# Patient Record
Sex: Female | Born: 1970 | Race: Black or African American | Hispanic: No | Marital: Married | Smoking: Current every day smoker
Health system: Southern US, Community
[De-identification: ages and names within clinical notes are randomized; demographics above are authoritative.]

## PROBLEM LIST (undated history)

## (undated) DIAGNOSIS — K219 Gastro-esophageal reflux disease without esophagitis: Secondary | ICD-10-CM

## (undated) DIAGNOSIS — I10 Essential (primary) hypertension: Secondary | ICD-10-CM

## (undated) DIAGNOSIS — D649 Anemia, unspecified: Secondary | ICD-10-CM

## (undated) HISTORY — PX: LIPOSUCTION AUOLOGOUS FAT TRANSFER TO BUTTOCKS: SHX6685

## (undated) HISTORY — PX: TUBAL LIGATION: SHX77

## (undated) HISTORY — PX: ABDOMINAL HYSTERECTOMY: SHX81

---

## 2006-07-24 ENCOUNTER — Emergency Department: Payer: Self-pay | Admitting: Emergency Medicine

## 2007-11-14 ENCOUNTER — Ambulatory Visit: Payer: Self-pay | Admitting: Family Medicine

## 2009-06-14 ENCOUNTER — Ambulatory Visit: Payer: Self-pay | Admitting: Family Medicine

## 2010-01-01 ENCOUNTER — Ambulatory Visit: Payer: Self-pay | Admitting: Internal Medicine

## 2011-01-10 ENCOUNTER — Ambulatory Visit: Payer: Self-pay | Admitting: Internal Medicine

## 2012-05-08 ENCOUNTER — Ambulatory Visit: Payer: Self-pay | Admitting: Internal Medicine

## 2013-07-09 ENCOUNTER — Ambulatory Visit: Payer: Self-pay | Admitting: Family Medicine

## 2013-07-09 LAB — URINALYSIS, COMPLETE
BILIRUBIN, UR: NEGATIVE
GLUCOSE, UR: NEGATIVE mg/dL (ref 0–75)
KETONE: NEGATIVE
Leukocyte Esterase: NEGATIVE
Nitrite: NEGATIVE
Ph: 6.5 (ref 4.5–8.0)
Specific Gravity: 1.02 (ref 1.003–1.030)

## 2013-07-09 LAB — WET PREP, GENITAL

## 2013-07-09 LAB — PREGNANCY, URINE: PREGNANCY TEST, URINE: NEGATIVE m[IU]/mL

## 2013-07-10 LAB — GC/CHLAMYDIA PROBE AMP

## 2013-07-12 LAB — URINE CULTURE

## 2013-07-25 ENCOUNTER — Ambulatory Visit: Payer: Self-pay | Admitting: Family Medicine

## 2014-01-23 ENCOUNTER — Emergency Department: Payer: Self-pay | Admitting: Emergency Medicine

## 2014-01-23 LAB — COMPREHENSIVE METABOLIC PANEL
ALT: 20 U/L
ANION GAP: 6 — AB (ref 7–16)
Albumin: 3.9 g/dL (ref 3.4–5.0)
Alkaline Phosphatase: 64 U/L
BUN: 16 mg/dL (ref 7–18)
Bilirubin,Total: 0.3 mg/dL (ref 0.2–1.0)
CO2: 25 mmol/L (ref 21–32)
CREATININE: 0.83 mg/dL (ref 0.60–1.30)
Calcium, Total: 8.7 mg/dL (ref 8.5–10.1)
Chloride: 108 mmol/L — ABNORMAL HIGH (ref 98–107)
EGFR (African American): >60
Glucose: 86 mg/dL (ref 65–99)
OSMOLALITY: 278 (ref 275–301)
Potassium: 3.5 mmol/L (ref 3.5–5.1)
SGOT(AST): 17 U/L (ref 15–37)
Sodium: 139 mmol/L (ref 136–145)
Total Protein: 7 g/dL (ref 6.4–8.2)

## 2014-01-23 LAB — CK TOTAL AND CKMB (NOT AT ARMC): CK, Total: 103 U/L

## 2014-01-23 LAB — CBC
HCT: 44.9 % (ref 35.0–47.0)
HGB: 14.1 g/dL (ref 12.0–16.0)
MCH: 29.4 pg (ref 26.0–34.0)
MCHC: 31.4 g/dL — AB (ref 32.0–36.0)
MCV: 93 fL (ref 80–100)
Platelet: 192 10*3/uL (ref 150–440)
RBC: 4.81 10*6/uL (ref 3.80–5.20)
RDW: 13.3 % (ref 11.5–14.5)
WBC: 5.7 10*3/uL (ref 3.6–11.0)

## 2014-01-23 LAB — TROPONIN I

## 2014-07-15 ENCOUNTER — Ambulatory Visit
Admit: 2014-07-15 | Disposition: A | Discharge status: Home / Self Care | Payer: Self-pay | Attending: Family Medicine | Admitting: Family Medicine

## 2015-05-05 ENCOUNTER — Encounter: Payer: Self-pay | Admitting: *Deleted

## 2015-05-05 ENCOUNTER — Ambulatory Visit
Admission: EM | Admit: 2015-05-05 | Discharge: 2015-05-05 | Disposition: A | Discharge status: Home / Self Care | Payer: BLUE CROSS/BLUE SHIELD | Attending: Family Medicine | Admitting: Family Medicine

## 2015-05-05 DIAGNOSIS — N760 Acute vaginitis: Secondary | ICD-10-CM | POA: Diagnosis not present

## 2015-05-05 HISTORY — DX: Essential (primary) hypertension: I10

## 2015-05-05 LAB — URINALYSIS COMPLETE WITH MICROSCOPIC (ARMC ONLY)
BILIRUBIN URINE: NEGATIVE
GLUCOSE, UA: NEGATIVE mg/dL
Ketones, ur: NEGATIVE mg/dL
Leukocytes, UA: NEGATIVE
Nitrite: NEGATIVE
PH: 7 (ref 5.0–8.0)
Protein, ur: NEGATIVE mg/dL
Specific Gravity, Urine: 1.02 (ref 1.005–1.030)

## 2015-05-05 LAB — WET PREP, GENITAL
SPERM: NONE SEEN
TRICH WET PREP: NONE SEEN

## 2015-05-05 MED ORDER — METRONIDAZOLE 500 MG PO TABS: 500.0000 mg | ORAL_TABLET | Freq: Two times a day (BID) | ORAL | Status: DC

## 2015-05-05 MED ORDER — FLUCONAZOLE 150 MG PO TABS: 150.0000 mg | ORAL_TABLET | Freq: Every day | ORAL | Status: DC

## 2015-05-05 NOTE — ED Provider Notes (Addendum)
CSN: [id number]     Arrival date & time 05/05/15  1755 History   First MD Initiated Contact with Patient 05/05/15 1807     Chief Complaint  Patient presents with  . Urinary Tract Infection   (Consider location/radiation/quality/duration/timing/severity/associated sxs/prior Treatment) HPI: Patient presents today with symptoms of urinary frequency and dysuria. Patient states that she's had symptoms for the last 2 days. She has a low-grade fever in the office but was unaware of this. She denies having URI symptoms. She does also have some vaginal discharge with itching. She denies any genital lesions. She is married and denies any risk of STDs. She denies any abdominal pain, nausea, vomiting, diarrhea, flank pain. She denies any history of kidney stones. She does admit that her menstrual cycle should be starting in the next week. She has had a history of a BTL.  Past Medical History  Diagnosis Date  . Hypertension    Past Surgical History  Procedure Laterality Date  . Tubal ligation     History reviewed. No pertinent family history. Social History  Substance Use Topics  . Smoking status: Former Research scientist (life sciences)  . Smokeless tobacco: Never Used  . Alcohol Use: Yes   OB History    No data available     Review of Systems: Negative except mentioned above.   Allergies  Review of patient's allergies indicates no known allergies.  Home Medications   Prior to Admission medications   Medication Sig Start Date End Date Taking? Authorizing Provider  lisinopril-hydrochlorothiazide (PRINZIDE,ZESTORETIC) 10-12.5 MG tablet Take 1 tablet by mouth daily.   Yes Historical Provider, MD   Meds Ordered and Administered this Visit  Medications - No data to display  BP 119/84 mmHg  Pulse 85  Temp(Src) 100.1 F (37.8 C) (Oral)  Resp 18  Ht 5\' 1"  (1.549 m)  Wt 190 lb (86.183 kg)  BMI 35.92 kg/m2  SpO2 100%  LMP 04/11/2015 No data found.   Physical Exam:  GENERAL: NAD HEENT: no pharyngeal  erythema, no exudate RESP: CTA B CARD: RRR ABD: +BS, NT/ND, no rebound or guarding GU: no genital lesions, minimal discharge, no CMT appreciated  NEURO: CN II-XII grossly intact    ED Course  Procedures (including critical care time)  Labs Review Labs Reviewed  URINALYSIS COMPLETEWITH MICROSCOPIC ([place]) - Abnormal; Notable for the following:    Hgb urine dipstick TRACE (*)    Bacteria, UA FEW (*)    Squamous Epithelial / LPF 6-30 (*)    All other components within normal limits  URINE CULTURE    Imaging Review No results found.   MDM   A/P: Vaginitis- urine analysis is not suggestive of UTI, will send urine for culture, wet prep suggest BV and yeast, will treat with Flagyl and Diflucan, I have asked that the patient monitor symptoms closely and if any worsening symptoms including fever that she seek medical attention promptly. Will await GC Chlamydia results and inform patient if positive. I've encourage the patient to not be sexually active until results are back. Temperature was repeated before discharge and was 98.3.   Paulina Fusi, MD 05/05/15 1944  Paulina Fusi, MD 05/05/15 [phone]

## 2015-05-05 NOTE — ED Notes (Signed)
Patient started having urinary frequency and burning on urination 2 days ago. Additional symptoms of itching and odor are also present.

## 2015-05-06 ENCOUNTER — Telehealth: Payer: Self-pay | Admitting: *Deleted

## 2015-05-06 LAB — CHLAMYDIA/NGC RT PCR (ARMC ONLY)
Chlamydia Tr: NOT DETECTED
N GONORRHOEAE: NOT DETECTED

## 2015-05-06 NOTE — ED Notes (Signed)
Called and informed patient that her test results came back negative. Patient confirmed understanding of direction.

## 2015-05-07 LAB — URINE CULTURE

## 2015-10-12 ENCOUNTER — Encounter: Payer: Self-pay | Admitting: Emergency Medicine

## 2015-10-12 DIAGNOSIS — R109 Unspecified abdominal pain: Secondary | ICD-10-CM | POA: Insufficient documentation

## 2015-10-12 DIAGNOSIS — Z87891 Personal history of nicotine dependence: Secondary | ICD-10-CM | POA: Insufficient documentation

## 2015-10-12 DIAGNOSIS — I10 Essential (primary) hypertension: Secondary | ICD-10-CM | POA: Diagnosis not present

## 2015-10-12 DIAGNOSIS — R200 Anesthesia of skin: Secondary | ICD-10-CM | POA: Diagnosis present

## 2015-10-12 DIAGNOSIS — D649 Anemia, unspecified: Secondary | ICD-10-CM | POA: Insufficient documentation

## 2015-10-12 DIAGNOSIS — E876 Hypokalemia: Secondary | ICD-10-CM | POA: Insufficient documentation

## 2015-10-12 DIAGNOSIS — Z79899 Other long term (current) drug therapy: Secondary | ICD-10-CM | POA: Insufficient documentation

## 2015-10-12 NOTE — ED Notes (Signed)
Patient presents to triage with c/o bilateral arm numbness, pt reports had lipo-fat transfer from abdomen on 10/06/15. Pt reports had arm numbness on Friday, pt states "my arms feel frozen like I can break them." Pt reports pain decreases with movement. Pt denies chest pain or shortness of breath. Bilateral equal hand grips.

## 2015-10-13 ENCOUNTER — Emergency Department: Payer: BLUE CROSS/BLUE SHIELD

## 2015-10-13 ENCOUNTER — Emergency Department
Admission: EM | Admit: 2015-10-13 | Discharge: 2015-10-13 | Disposition: A | Discharge status: Home / Self Care | Payer: BLUE CROSS/BLUE SHIELD | Attending: Emergency Medicine | Admitting: Emergency Medicine

## 2015-10-13 DIAGNOSIS — D649 Anemia, unspecified: Secondary | ICD-10-CM

## 2015-10-13 DIAGNOSIS — E876 Hypokalemia: Secondary | ICD-10-CM

## 2015-10-13 LAB — COMPREHENSIVE METABOLIC PANEL
ALBUMIN: 3.3 g/dL — AB (ref 3.5–5.0)
ALK PHOS: 44 U/L (ref 38–126)
ALT: 45 U/L (ref 14–54)
ANION GAP: 9 (ref 5–15)
AST: 51 U/L — AB (ref 15–41)
BUN: 13 mg/dL (ref 6–20)
CO2: 28 mmol/L (ref 22–32)
Calcium: 8.5 mg/dL — ABNORMAL LOW (ref 8.9–10.3)
Chloride: 98 mmol/L — ABNORMAL LOW (ref 101–111)
Creatinine, Ser: 0.81 mg/dL (ref 0.44–1.00)
GFR calc Af Amer: >60 mL/min (ref >60)
GFR calc non Af Amer: >60 mL/min (ref >60)
GLUCOSE: 107 mg/dL — AB (ref 65–99)
POTASSIUM: 2.9 mmol/L — AB (ref 3.5–5.1)
SODIUM: 135 mmol/L (ref 135–145)
Total Bilirubin: 0.7 mg/dL (ref 0.3–1.2)
Total Protein: 5.9 g/dL — ABNORMAL LOW (ref 6.5–8.1)

## 2015-10-13 LAB — CBC
HEMATOCRIT: 22.5 % — AB (ref 35.0–47.0)
HEMOGLOBIN: 7.6 g/dL — AB (ref 12.0–16.0)
MCH: 27.4 pg (ref 26.0–34.0)
MCHC: 33.9 g/dL (ref 32.0–36.0)
MCV: 80.7 fL (ref 80.0–100.0)
Platelets: 318 10*3/uL (ref 150–440)
RBC: 2.79 MIL/uL — ABNORMAL LOW (ref 3.80–5.20)
RDW: 13.8 % (ref 11.5–14.5)
WBC: 7.9 10*3/uL (ref 3.6–11.0)

## 2015-10-13 LAB — ABO/RH: ABO/RH(D): A POS

## 2015-10-13 LAB — PREPARE RBC (CROSSMATCH)

## 2015-10-13 MED ORDER — POTASSIUM CHLORIDE 20 MEQ PO PACK
40.0000 meq | PACK | Freq: Once | ORAL | Status: AC
  Administered 2015-10-13: 40 meq via ORAL
  Filled 2015-10-13: qty 2

## 2015-10-13 MED ORDER — SODIUM CHLORIDE 0.9 % IV SOLN
10.0000 mL/h | Freq: Once | INTRAVENOUS | Status: AC
  Administered 2015-10-13: 10 mL/h via INTRAVENOUS

## 2015-10-13 MED ORDER — IOPAMIDOL (ISOVUE-300) INJECTION 61%
100.0000 mL | Freq: Once | INTRAVENOUS | Status: AC | PRN
  Administered 2015-10-13: 100 mL via INTRAVENOUS

## 2015-10-13 MED ORDER — POTASSIUM CHLORIDE 20 MEQ PO PACK
40.0000 meq | PACK | Freq: Two times a day (BID) | ORAL | Status: DC
  Administered 2015-10-13: 40 meq via ORAL
  Filled 2015-10-13: qty 2

## 2015-10-13 MED ORDER — DIATRIZOATE MEGLUMINE & SODIUM 66-10 % PO SOLN
15.0000 mL | Freq: Once | ORAL | Status: AC
  Administered 2015-10-13: 15 mL via ORAL

## 2015-10-13 NOTE — ED Notes (Signed)
2nd unit of blood completed. No adverse reactions noted. VSS.

## 2015-10-13 NOTE — Discharge Instructions (Signed)
Anemia, Nonspecific Anemia is a condition in which the concentration of red blood cells or hemoglobin in the blood is below normal. Hemoglobin is a substance in red blood cells that carries oxygen to the tissues of the body. Anemia results in not enough oxygen reaching these tissues.  CAUSES  Common causes of anemia include:   Excessive bleeding. Bleeding may be internal or external. This includes excessive bleeding from periods (in women) or from the intestine.   Poor nutrition.   Chronic kidney, thyroid, and liver disease.  Bone marrow disorders that decrease red blood cell production.  Cancer and treatments for cancer.  HIV, AIDS, and their treatments.  Spleen problems that increase red blood cell destruction.  Blood disorders.  Excess destruction of red blood cells due to infection, medicines, and autoimmune disorders. SIGNS AND SYMPTOMS   Minor weakness.   Dizziness.   Headache.  Palpitations.   Shortness of breath, especially with exercise.   Paleness.  Cold sensitivity.  Indigestion.  Nausea.  Difficulty sleeping.  Difficulty concentrating. Symptoms may occur suddenly or they may develop slowly.  DIAGNOSIS  Additional blood tests are often needed. These help your health care provider determine the best treatment. Your health care provider will check your stool for blood and look for other causes of blood loss.  TREATMENT  Treatment varies depending on the cause of the anemia. Treatment can include:   Supplements of iron, vitamin 123456, or folic acid.   Hormone medicines.   A blood transfusion. This may be needed if blood loss is severe.   Hospitalization. This may be needed if there is significant continual blood loss.   Dietary changes.  Spleen removal. HOME CARE INSTRUCTIONS Keep all follow-up appointments. It often takes many weeks to correct anemia, and having your health care provider check on your condition and your response to  treatment is very important. SEEK IMMEDIATE MEDICAL CARE IF:   You develop extreme weakness, shortness of breath, or chest pain.   You become dizzy or have trouble concentrating.  You develop heavy vaginal bleeding.   You develop a rash.   You have bloody or black, tarry stools.   You faint.   You vomit up blood.   You vomit repeatedly.   You have abdominal pain.  You have a fever or persistent symptoms for more than 2-3 days.   You have a fever and your symptoms suddenly get worse.   You are dehydrated.  MAKE SURE YOU:  Understand these instructions.  Will watch your condition.  Will get help right away if you are not doing well or get worse.   This information is not intended to replace advice given to you by your health care provider. Make sure you discuss any questions you have with your health care provider.   Document Released: 04/27/2004 Document Revised: 11/20/2012 Document Reviewed: 09/13/2012 Elsevier Interactive Patient Education 2016 [employer].  Hypokalemia Hypokalemia means that the amount of potassium in the blood is lower than normal.Potassium is a chemical, called an electrolyte, that helps regulate the amount of fluid in the body. It also stimulates muscle contraction and helps nerves function properly.Most of the body's potassium is inside of cells, and only a very small amount is in the blood. Because the amount in the blood is so small, minor changes can be life-threatening. CAUSES  Antibiotics.  Diarrhea or vomiting.  Using laxatives too much, which can cause diarrhea.  Chronic kidney disease.  Water pills (diuretics).  Eating disorders (bulimia).  Low  magnesium level.  Sweating a lot. SIGNS AND SYMPTOMS  Weakness.  Constipation.  Fatigue.  Muscle cramps.  Mental confusion.  Skipped heartbeats or irregular heartbeat (palpitations).  Tingling or numbness. DIAGNOSIS  Your health care provider can diagnose  hypokalemia with blood tests. In addition to checking your potassium level, your health care provider may also check other lab tests. TREATMENT Hypokalemia can be treated with potassium supplements taken by mouth or adjustments in your current medicines. If your potassium level is very low, you may need to get potassium through a vein (IV) and be monitored in the hospital. A diet high in potassium is also helpful. Foods high in potassium are:  Nuts, such as peanuts and pistachios.  Seeds, such as sunflower seeds and pumpkin seeds.  Peas, lentils, and lima beans.  Whole grain and bran cereals and breads.  Fresh fruit and vegetables, such as apricots, avocado, bananas, cantaloupe, kiwi, oranges, tomatoes, asparagus, and potatoes.  Orange and tomato juices.  Red meats.  Fruit yogurt. HOME CARE INSTRUCTIONS  Take all medicines as prescribed by your health care provider.  Maintain a healthy diet by including nutritious food, such as fruits, vegetables, nuts, whole grains, and lean meats.  If you are taking a laxative, be sure to follow the directions on the label. SEEK MEDICAL CARE IF:  Your weakness gets worse.  You feel your heart pounding or racing.  You are vomiting or having diarrhea.  You are diabetic and having trouble keeping your blood glucose in the normal range. SEEK IMMEDIATE MEDICAL CARE IF:  You have chest pain, shortness of breath, or dizziness.  You are vomiting or having diarrhea for more than 2 days.  You faint. MAKE SURE YOU:   Understand these instructions.  Will watch your condition.  Will get help right away if you are not doing well or get worse.   This information is not intended to replace advice given to you by your health care provider. Make sure you discuss any questions you have with your health care provider.   Document Released: 03/20/2005 Document Revised: 04/10/2014 Document Reviewed: 09/20/2012 Elsevier Interactive Patient Education  [employer].

## 2015-10-13 NOTE — ED Notes (Signed)
Lab notified this RN that blood will not be ready for another hour. Pt made aware. vss at this time.

## 2015-10-13 NOTE — ED Notes (Signed)
Patient and her son given [place] meal tray and water.

## 2015-10-13 NOTE — ED Notes (Signed)
Blood transfusing into vein at this time. This RN at bedside.

## 2015-10-13 NOTE — ED Provider Notes (Signed)
Patient tolerated blood ordered by Dr. Owens Shark without any difficulty. She is stable for outpatient follow-up.  Katherine Newport, MD 10/13/15 [phone]

## 2015-10-13 NOTE — ED Notes (Signed)
1st bag of blood completed. Patient tolerated well. No adverse reactions noted. VSS.

## 2015-10-13 NOTE — ED Notes (Signed)
Dr. Owens Shark at bedside talking to the Pt about the plan of action.

## 2015-10-13 NOTE — ED Notes (Signed)
Patient is tolerating blood transfusion well. Patient denies any adverse reactions. Patient is resting comfortably on stomach with children at bedside. VSS.

## 2015-10-13 NOTE — ED Provider Notes (Signed)
[facility] Emergency Department Provider Note  ____________________________________________  Time seen: 3:00 AM  I have reviewed the triage vital signs and the nursing notes.   HISTORY  Chief Complaint Numbness      HPI Katherine Berger is a 45 y.o. female status post abdominal liposuction with [place] gluteal lift one week ago in [place] presents with bilateral upper extremity numbness and cramping pain. Patient admits to fatigue and dizziness with standing. Patient denies any upper extremity weakness. Patient states that her pain is improved since surgery in her abdomen and gluteal region. Patient states the numbness and pain in her arms or worse at night    Past Medical History  Diagnosis Date  . Hypertension     There are no active problems to display for this patient.   Past Surgical History  Procedure Laterality Date  . Tubal ligation    . Liposuction auologous fat transfer to buttocks      Current Outpatient Rx  Name  Route  Sig  Dispense  Refill  . ciprofloxacin (CIPRO) 500 MG tablet   Oral   Take 500 mg by mouth 2 (two) times daily. For 7 days         . fluconazole (DIFLUCAN) 150 MG tablet   Oral   Take 150 mg by mouth once.         . Hydrocodone-Acetaminophen 7.5-300 MG TABS   Oral   Take 1 tablet by mouth every 6 (six) hours as needed (for pain).          Marland Kitchen lisinopril-hydrochlorothiazide (PRINZIDE,ZESTORETIC) 20-12.5 MG tablet   Oral   Take 1 tablet by mouth daily.           Allergies No known drug allergies No family history on file.  Social History Social History  Substance Use Topics  . Smoking status: Former Research scientist (life sciences)  . Smokeless tobacco: Never Used  . Alcohol Use: Yes    Review of Systems  Constitutional: Negative for fever. Eyes: Negative for visual changes. ENT: Negative for sore throat. Cardiovascular: Negative for chest pain. Respiratory: Negative for shortness of breath. Gastrointestinal:  Negative for abdominal pain, vomiting and diarrhea. Genitourinary: Negative for dysuria. Musculoskeletal: Negative for back pain. Bilateral arm pain Skin: Negative for rash. Neurological: Negative for headaches, focal weakness or numbness.   10-point ROS otherwise negative.  ____________________________________________   PHYSICAL EXAM:  VITAL SIGNS: ED Triage Vitals  Enc Vitals Group     BP 10/12/15 2333 108/66 mmHg     Pulse Rate 10/12/15 2333 115     Resp 10/12/15 2333 18     Temp 10/12/15 2333 98.8 F (37.1 C)     Temp Source 10/12/15 2333 Oral     SpO2 10/12/15 2333 100 %     Weight 10/12/15 2333 180 lb (81.647 kg)     Height 10/12/15 2333 5\' 2"  (1.575 m)     Head Cir --      Peak Flow --      Pain Score 10/12/15 2334 10     Pain Loc --      Pain Edu? --      Excl. in [place]? --      Constitutional: Alert and oriented. Well appearing and in no distress. Eyes: Conjunctivae are normal. PERRL. Normal extraocular movements. ENT   Head: Normocephalic and atraumatic.   Nose: No congestion/rhinnorhea.   Mouth/Throat: Mucous membranes are moist.   Neck: No stridor. Hematological/Lymphatic/Immunilogical: No cervical lymphadenopathy. Cardiovascular: Normal rate, regular rhythm.  Normal and symmetric distal pulses are present in all extremities. No murmurs, rubs, or gallops. Respiratory: Normal respiratory effort without tachypnea nor retractions. Breath sounds are clear and equal bilaterally. No wheezes/rales/rhonchi. Gastrointestinal: Soft and nontender. No distention. There is no CVA tenderness.Liposuction sites clean dry and intact with no signs of infection Genitourinary: deferred Musculoskeletal: Nontender with normal range of motion in all extremities. No joint effusions.  No lower extremity tenderness nor edema. Neurologic:  Normal speech and language. No gross focal neurologic deficits are appreciated. Speech is normal.  Skin:  Skin is warm, dry and intact.  No rash noted. Psychiatric: Mood and affect are normal. Speech and behavior are normal. Patient exhibits appropriate insight and judgment.  ____________________________________________    LABS (pertinent positives/negatives)  Labs Reviewed  CBC - Abnormal; Notable for the following:    RBC 2.79 (*)    Hemoglobin 7.6 (*)    HCT 22.5 (*)    All other components within normal limits  COMPREHENSIVE METABOLIC PANEL - Abnormal; Notable for the following:    Potassium 2.9 (*)    Chloride 98 (*)    Glucose, Bld 107 (*)    Calcium 8.5 (*)    Total Protein 5.9 (*)    Albumin 3.3 (*)    AST 51 (*)    All other components within normal limits  PREPARE RBC (CROSSMATCH)  TYPE AND SCREEN  ABO/RH         RADIOLOGY  CT Abdomen Pelvis W Contrast (Final result) Result time: 10/13/15 05:07:29   Final result by Rad Results In Interface (10/13/15 05:07:29)   Narrative:   CLINICAL DATA: Gluteal and abdominal pain after liposuction. Marked anemia. Arm numbness.  EXAM: CT ABDOMEN AND PELVIS WITH CONTRAST  TECHNIQUE: Multidetector CT imaging of the abdomen and pelvis was performed using the standard protocol following bolus administration of intravenous contrast.  CONTRAST: 150mL ISOVUE-300 IOPAMIDOL (ISOVUE-300) INJECTION 61%  COMPARISON: Ultrasound pelvis 05/08/2012. CT pelvis 11/14/2007. The lung bases are clear. Small esophageal hiatal hernia.  Mild diffuse fatty infiltration of the liver. The gallbladder, pancreas, spleen, adrenal glands, kidneys, abdominal aorta, inferior vena cava, and retroperitoneal lymph nodes are unremarkable. Stomach, small bowel, and colon are not abnormally distended. Diffusely stool-filled colon. No free air or free fluid in the abdomen.  Pelvis: Appendix is normal. Cyst in the right ovary measuring about 4 cm diameter. This is likely to be functional in a premenopausal patient. Somewhat nodular appearance of the uterus suggests  fibroid. Bladder wall is not thickened. No free or loculated pelvic fluid collections.  There is infiltration throughout the subcutaneous fat of the anterior abdomen and pelvis and along the flank regions. Subcutaneous emphysema in the right abdominal wall and right flank. Subcutaneous emphysema may be postoperative but infection is not excluded. Infiltration suggest either cellulitis or hematomas. No discrete fluid collection is identified. Fat within the right gluteal muscles may be postoperative. No destructive bone lesions.  FINDINGS: Diffuse infiltration throughout the subcutaneous fat of the abdomen and pelvis with subcutaneous emphysema vertically on the right. Changes likely to represent cellulitis, edema, or hemorrhage. No discrete loculated fluid collections identified. Fat in the right gluteal muscles may represent postoperative change. Uterine fibroids. Diffuse fatty infiltration of the liver. Small esophageal hiatal hernia. Probable physiologic cyst in the right ovary.   Electronically Signed By: Lucienne Capers M.D. On: 10/13/2015 05:07        ECG Results      Critical care:CRITICAL CARE Performed by: Gregor Hams   Total  critical care time: 30 minutes  Critical care time was exclusive of separately billable procedures and treating other patients.  Critical care was necessary to treat or prevent imminent or life-threatening deterioration.  Critical care was time spent personally by me on the following activities: development of treatment plan with patient and/or surrogate as well as nursing, discussions with consultants, evaluation of patient's response to treatment, examination of patient, obtaining history from patient or surrogate, ordering and performing treatments and interventions, ordering and review of laboratory studies, ordering and review of radiographic studies, pulse oximetry and re-evaluation of patient's  condition.  Procedures     INITIAL IMPRESSION / ASSESSMENT AND PLAN / ED COURSE  Pertinent labs & imaging results that were available during my care of the patient were reviewed by me and considered in my medical decision making (see chart for details).  CT scan findings discussed with Dr. Adonis Huguenin general surgeon on call who stated that they were consistent with liposuction. I suspect the patient's paresthesias and pain in the upper extremity secondary to hypokalemia as her potassium was 2.9. Following administration of 40 mEq of by mouth potassium patient states that discomfort and numbness has improved. Patient also noted to be anemic. Review the patient's laboratory data before surgery revealed a hemoglobin of 12. Current hemoglobin 7.6. Patient is not aware of any excessive blood loss during the surgery. Given patient's tachycardia and dizziness with standing and fatigue hemoglobin less than 8 will transfuse 2 units packed red blood cells.  ____________________________________________   FINAL CLINICAL IMPRESSION(S) / ED DIAGNOSES  Final diagnoses:  Anemia, unspecified anemia type  Hypokalemia      Gregor Hams, MD 10/17/15 [phone]

## 2015-10-13 NOTE — ED Notes (Signed)
Lab called to notify of the delay on the results of the type and screen.

## 2015-10-13 NOTE — ED Notes (Signed)
15 min post blood transfusion vitals obtained and normal, no signs of reaction at this time. Rate of infusion increased to 251ml/hr. Cont to monitor.

## 2015-10-14 LAB — TYPE AND SCREEN
ABO/RH(D): A POS
Antibody Screen: POSITIVE
DONOR AG TYPE: NEGATIVE
Donor AG Type: NEGATIVE
PT AG Type: NEGATIVE
UNIT DIVISION: 0
UNIT DIVISION: 0
Unit division: 0

## 2015-11-29 ENCOUNTER — Telehealth: Payer: Self-pay | Admitting: Emergency Medicine

## 2015-11-29 ENCOUNTER — Ambulatory Visit
Admission: EM | Admit: 2015-11-29 | Discharge: 2015-11-29 | Disposition: A | Discharge status: Home / Self Care | Payer: BLUE CROSS/BLUE SHIELD | Attending: Family Medicine | Admitting: Family Medicine

## 2015-11-29 ENCOUNTER — Encounter: Payer: Self-pay | Admitting: *Deleted

## 2015-11-29 DIAGNOSIS — N760 Acute vaginitis: Secondary | ICD-10-CM | POA: Diagnosis not present

## 2015-11-29 DIAGNOSIS — A499 Bacterial infection, unspecified: Secondary | ICD-10-CM

## 2015-11-29 DIAGNOSIS — R300 Dysuria: Secondary | ICD-10-CM

## 2015-11-29 DIAGNOSIS — B9689 Other specified bacterial agents as the cause of diseases classified elsewhere: Secondary | ICD-10-CM

## 2015-11-29 LAB — WET PREP, GENITAL
Sperm: NONE SEEN
Trich, Wet Prep: NONE SEEN
Yeast Wet Prep HPF POC: NONE SEEN

## 2015-11-29 LAB — URINALYSIS COMPLETE WITH MICROSCOPIC (ARMC ONLY)
BILIRUBIN URINE: NEGATIVE
Bacteria, UA: NONE SEEN
GLUCOSE, UA: NEGATIVE mg/dL
Ketones, ur: NEGATIVE mg/dL
Leukocytes, UA: NEGATIVE
NITRITE: NEGATIVE
Protein, ur: NEGATIVE mg/dL
SPECIFIC GRAVITY, URINE: 1.025 (ref 1.005–1.030)
pH: 7 (ref 5.0–8.0)

## 2015-11-29 LAB — CHLAMYDIA/NGC RT PCR (ARMC ONLY)
CHLAMYDIA TR: NOT DETECTED
N GONORRHOEAE: NOT DETECTED

## 2015-11-29 MED ORDER — METRONIDAZOLE 500 MG PO TABS: 500.0000 mg | ORAL_TABLET | Freq: Two times a day (BID) | ORAL | 0 refills | Status: DC

## 2015-11-29 MED ORDER — FLUCONAZOLE 150 MG PO TABS: ORAL_TABLET | ORAL | 0 refills | Status: DC

## 2015-11-29 NOTE — ED Provider Notes (Signed)
[facility] ____________________________________________  Time seen: Approximately 4:30PM  I have reviewed the triage vital signs and the nursing notes.   HISTORY  Chief Complaint Dysuria   HPI APPLE BENEDICTO is a 45 y.o. female presents for the complaints of urinary frequency and urinary urgency since Saturday with some mild occasional vaginal discharge. States vaginal discharges non-odorous. Reports continues to eat and drink well. Denies known trigger for same complaints. Patient reports she is sexually active with one partner, denies concerns for STDs. Does report she occasionally douches. Reports history of urinary tract infections occasionally as well as bacterial vaginosis with similar presentation.  Denies fevers, rash, skin changes, abnormal bleeding, abdominal pain, back pain, headaches, extremity pain or extremity swelling. Denies pelvic pain or vaginal pain. Denies any rash or lesions.    Patient's last menstrual period was 11/19/2015 (approximate).Denies chance of pregnancy.  [facility]: PCP    Past Medical History:  Diagnosis Date  . Hypertension   Recent blood transfusion after [place] gluteal lift and abdomen liposuction in July: denies any abnormal bleeding, heavy menstrual or blood in stool or abnormal bruising. States that she feels like she is healing well from this surgery and denies complications.   There are no active problems to display for this patient.   Past Surgical History:  Procedure Laterality Date  . LIPOSUCTION AUOLOGOUS FAT TRANSFER TO BUTTOCKS    . TUBAL LIGATION     No current facility-administered medications for this encounter.   Current Outpatient Prescriptions:  .  lisinopril-hydrochlorothiazide (PRINZIDE,ZESTORETIC) 20-12.5 MG tablet, Take 1 tablet by mouth daily., Disp: , Rfl:  .  ciprofloxacin (CIPRO) 500 MG tablet, Take 500 mg by mouth 2 (two) times daily. For 7 days, Disp: , Rfl:  .  fluconazole  (DIFLUCAN) 150 MG tablet, Take 150 mg by mouth once., Disp: , Rfl:  .  fluconazole (DIFLUCAN) 150 MG tablet, Take 1 tablet once for yeast infection, Disp: 1 tablet, Rfl: 0 .  Hydrocodone-Acetaminophen 7.5-300 MG TABS, Take 1 tablet by mouth every 6 (six) hours as needed (for pain). , Disp: , Rfl:  .  metroNIDAZOLE (FLAGYL) 500 MG tablet, Take 1 tablet (500 mg total) by mouth 2 (two) times daily., Disp: 14 tablet, Rfl: 0  Allergies Review of patient's allergies indicates no known allergies.  History reviewed. No pertinent family history.  Social History Social History  Substance Use Topics  . Smoking status: Former Research scientist (life sciences)  . Smokeless tobacco: Never Used  . Alcohol use Yes    Review of Systems Constitutional: No fever/chills Eyes: No visual changes. ENT: No sore throat. Cardiovascular: Denies chest pain. Respiratory: Denies shortness of breath. Gastrointestinal: No abdominal pain.  No nausea, no vomiting.  No diarrhea.  No constipation. Genitourinary: positive for dysuria. Musculoskeletal: Negative for back pain. Skin: Negative for rash. Neurological: Negative for headaches, focal weakness or numbness.  10-point ROS otherwise negative.  ____________________________________________   PHYSICAL EXAM:  VITAL SIGNS: ED Triage Vitals  Enc Vitals Group     BP 11/29/15 1543 113/70     Pulse Rate 11/29/15 1543 74     Resp 11/29/15 1543 16     Temp 11/29/15 1543 98 F (36.7 C)     Temp Source 11/29/15 1543 Oral     SpO2 11/29/15 1543 100 %     Weight 11/29/15 1545 175 lb (79.4 kg)     Height 11/29/15 1545 5\' 2"  (1.575 m)     Head Circumference --  Peak Flow --      Pain Score 11/29/15 1706 3     Pain Loc --      Pain Edu? --      Excl. in [place]? --     Constitutional: Alert and oriented. Well appearing and in no acute distress. Eyes: Conjunctivae are normal. PERRL. EOMI. ENT      Head: Normocephalic and atraumatic.      Mouth/Throat: Mucous membranes are  moist.Oropharynx non-erythematous. Cardiovascular: Normal rate, regular rhythm. Grossly normal heart sounds.  Good peripheral circulation. Respiratory: Normal respiratory effort without tachypnea nor retractions. Breath sounds are clear and equal bilaterally. No wheezes/rales/rhonchi.. Gastrointestinal: Soft and nontender. No distention. Normal Bowel sounds. No CVA tenderness. Pelvic: Completed with Heather RN at bedside. External: Normal appearance, no rash or lesions. Speculum: Mild white vaginal discharge, no bleeding, no foreign bodies. Cervical os closed. Bimanual: Nontender, no cervical or adnexal tenderness.  Musculoskeletal:  Nontender with normal range of motion in all extremities. No midline cervical, thoracic or lumbar tenderness to palpation.  Neurologic:  Normal speech and language. No gross focal neurologic deficits are appreciated. Speech is normal. No gait instability.  Skin:  Skin is warm, dry and intact. No rash noted. Psychiatric: Mood and affect are normal. Speech and behavior are normal. Patient exhibits appropriate insight and judgment    ___________________________________________   LABS (all labs ordered are listed, but only abnormal results are displayed)  Labs Reviewed  WET PREP, GENITAL - Abnormal; Notable for the following:       Result Value   Clue Cells Wet Prep HPF POC PRESENT (*)    WBC, Wet Prep HPF POC FEW (*)    All other components within normal limits  URINALYSIS COMPLETEWITH MICROSCOPIC ([facility] ONLY) - Abnormal; Notable for the following:    Hgb urine dipstick TRACE (*)    Squamous Epithelial / LPF 0-5 (*)    All other components within normal limits  CHLAMYDIA/NGC RT PCR ([facility] ONLY)    PROCEDURES Procedures    INITIAL IMPRESSION / ASSESSMENT AND PLAN / ED COURSE  Pertinent labs & imaging results that were available during my care of the patient were reviewed by me and considered in my medical decision making (see chart for  details).  Very well-appearing patient. No acute distress. Presents for the complaints of dysuria. Also reports some vaginal discharge. Urinalysis not suggestive of urinary tract infection, pelvic exam completed. Wet prep showing clue cells. Will treat bacterial vaginosis with oral Flagyl. Discussed supportive care. Educated regarding douching and contributing factors to bacterial vaginosis.Discussed indication, risks and benefits of medications with patient.  Discussed follow up with Primary care physician this week. Discussed follow up and return parameters including no resolution or any worsening concerns. Patient verbalized understanding and agreed to plan.   ____________________________________________   FINAL CLINICAL IMPRESSION(S) / ED DIAGNOSES  Final diagnoses:  BV (bacterial vaginosis)  Dysuria     Discharge Medication List as of 11/29/2015  4:58 PM    START taking these medications   Details  metroNIDAZOLE (FLAGYL) 500 MG tablet Take 1 tablet (500 mg total) by mouth 2 (two) times daily., Starting Mon 11/29/2015, Normal        Note: This dictation was prepared with Dragon dictation along with smaller phrase technology. Any transcriptional errors that result from this process are unintentional.    Clinical Course      Marylene Land, NP 12/07/15 2310

## 2015-11-29 NOTE — Telephone Encounter (Signed)
Patient was seen today at [facility]. Patient requesting a medication for yeast in case the Flagyl causes her to have a yeast infection.  Marylene Land, NP would like Diflucan 150mg  1 tablet once for yeast infection Dispense 1 with no refills sent to patient's pharmacy.

## 2015-11-29 NOTE — ED Triage Notes (Signed)
Dysuria, urinary freq/urg since Saturday.

## 2015-11-29 NOTE — Discharge Instructions (Signed)
Take medication as prescribed. Rest. Drink plenty of fluids.  ° °Follow up with your primary care physician this week as needed. Return to Urgent care for new or worsening concerns.  ° °

## 2016-05-18 ENCOUNTER — Other Ambulatory Visit: Payer: Self-pay | Admitting: Family Medicine

## 2016-05-18 DIAGNOSIS — Z1231 Encounter for screening mammogram for malignant neoplasm of breast: Secondary | ICD-10-CM

## 2016-05-23 ENCOUNTER — Other Ambulatory Visit: Payer: Self-pay | Admitting: Family Medicine

## 2016-05-23 ENCOUNTER — Encounter (INDEPENDENT_AMBULATORY_CARE_PROVIDER_SITE_OTHER): Payer: Self-pay

## 2016-05-23 ENCOUNTER — Ambulatory Visit
Admission: RE | Admit: 2016-05-23 | Discharge: 2016-05-23 | Disposition: A | Discharge status: Home / Self Care | Payer: BLUE CROSS/BLUE SHIELD | Source: Ambulatory Visit | Attending: Family Medicine | Admitting: Family Medicine

## 2016-05-23 DIAGNOSIS — Z1231 Encounter for screening mammogram for malignant neoplasm of breast: Secondary | ICD-10-CM

## 2017-07-17 IMAGING — MG 2D DIGITAL SCREENING BILATERAL MAMMOGRAM WITH CAD AND ADJUNCT TO
8 of 12 series · 8 of 28 positions shown · non-contrast
Comparison: Previous exam(s).

CLINICAL DATA: Screening.

EXAM:
2D DIGITAL SCREENING BILATERAL MAMMOGRAM WITH CAD AND ADJUNCT TOMO

[R MLO synth-2D]
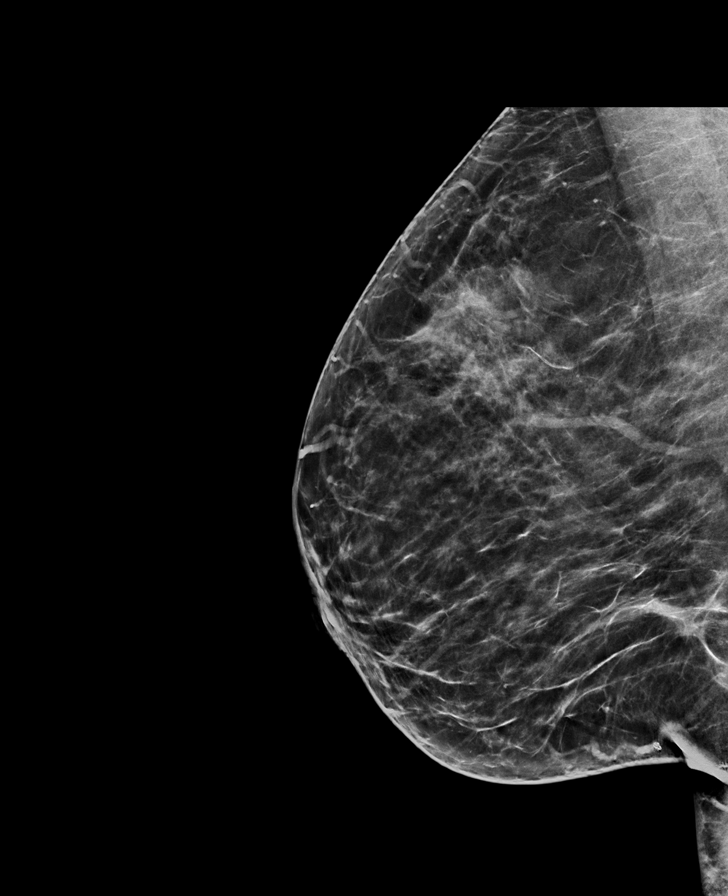

[R CC]
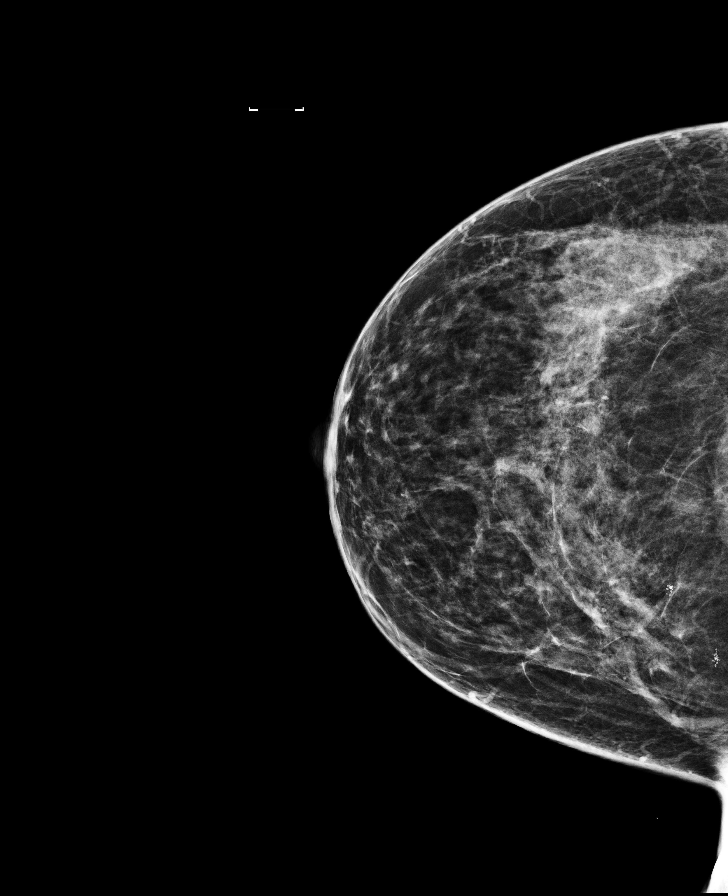

[R MLO]
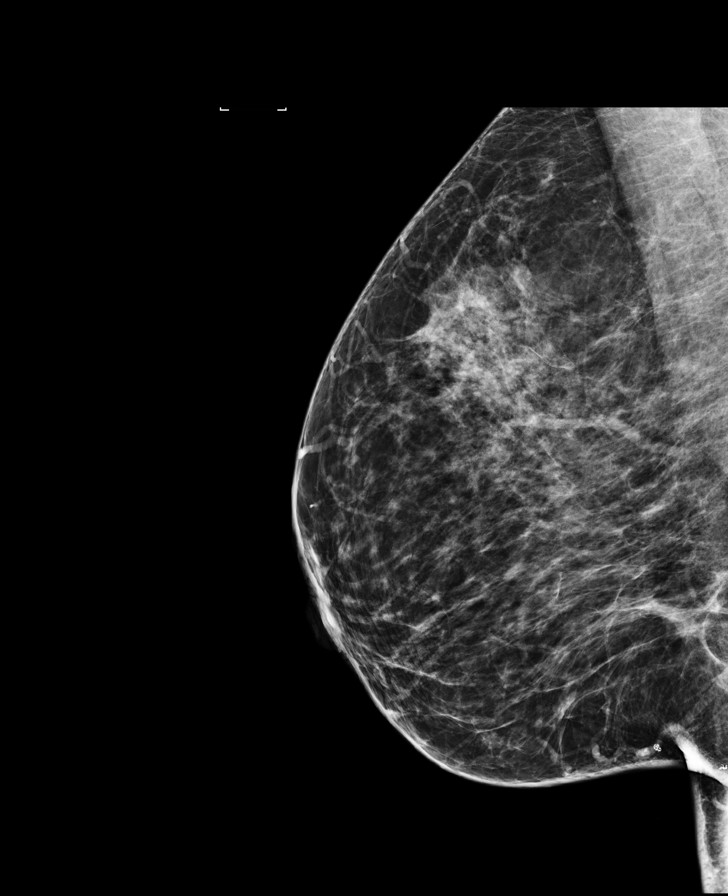

[L CC synth-2D]
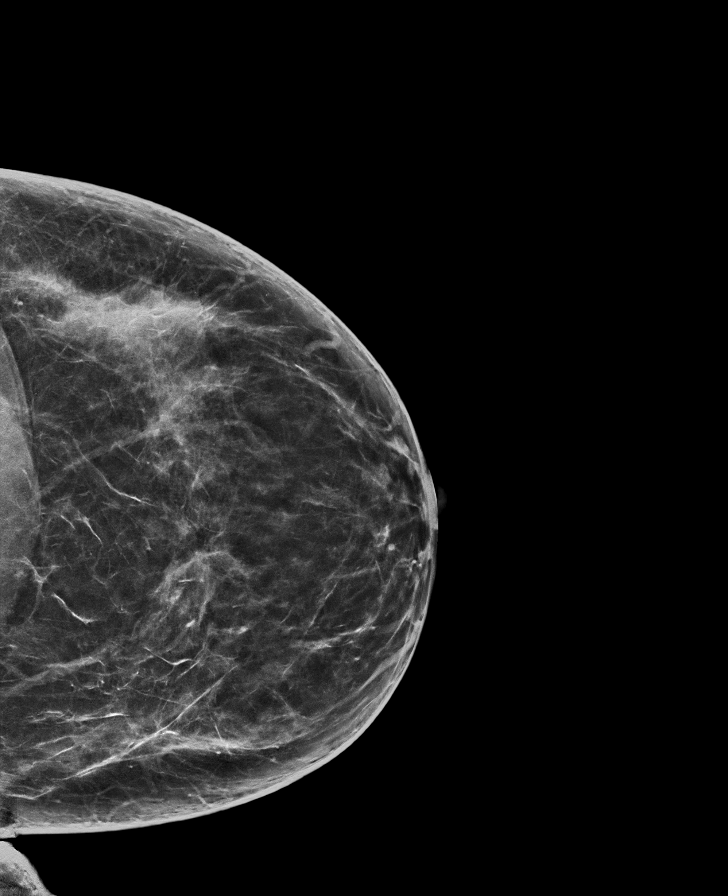

[L MLO synth-2D]
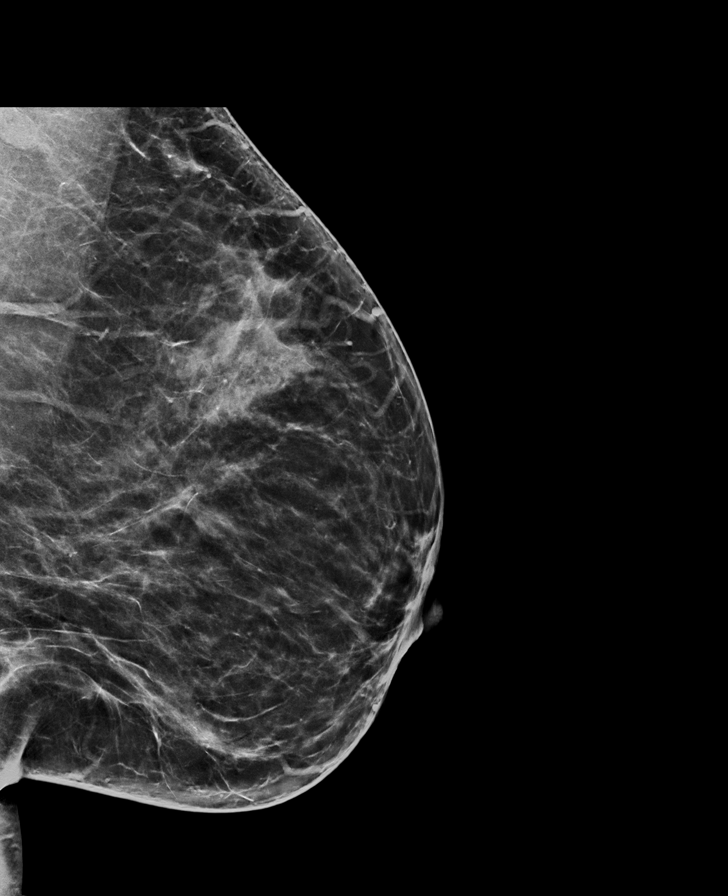

[R CC synth-2D]
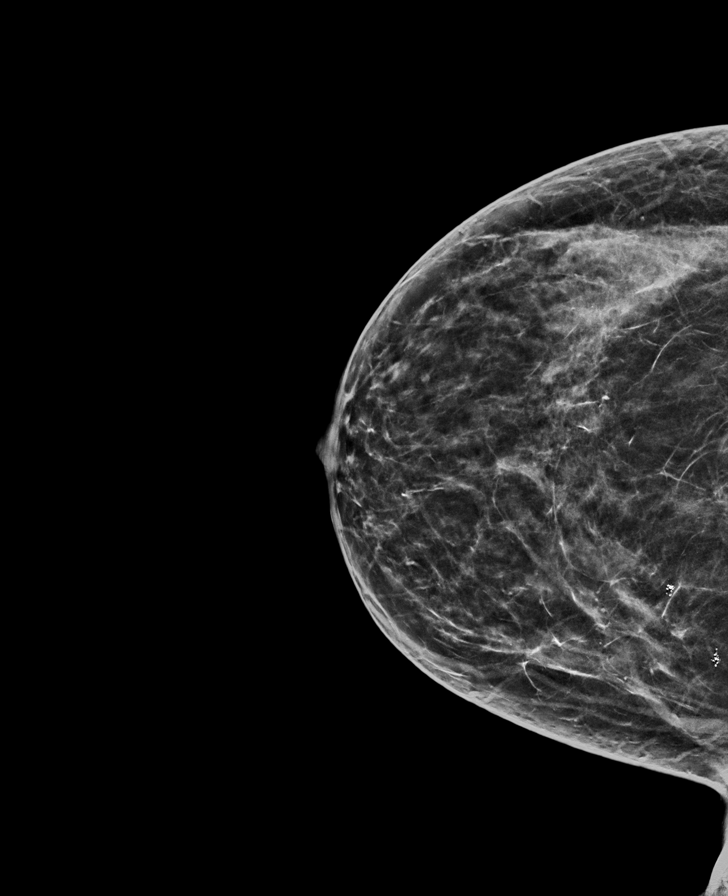

[L MLO]
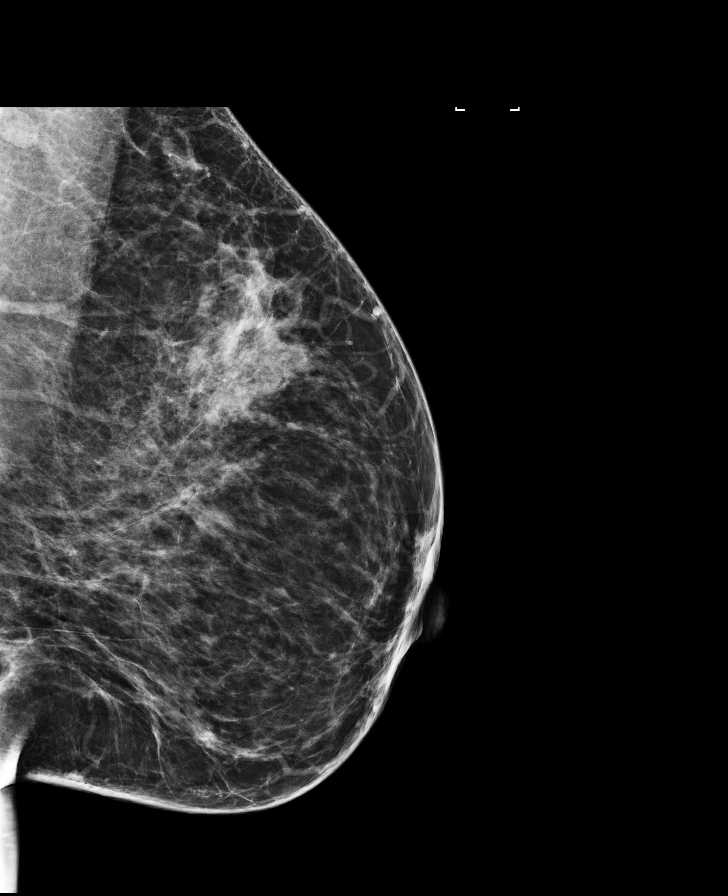

[L CC]
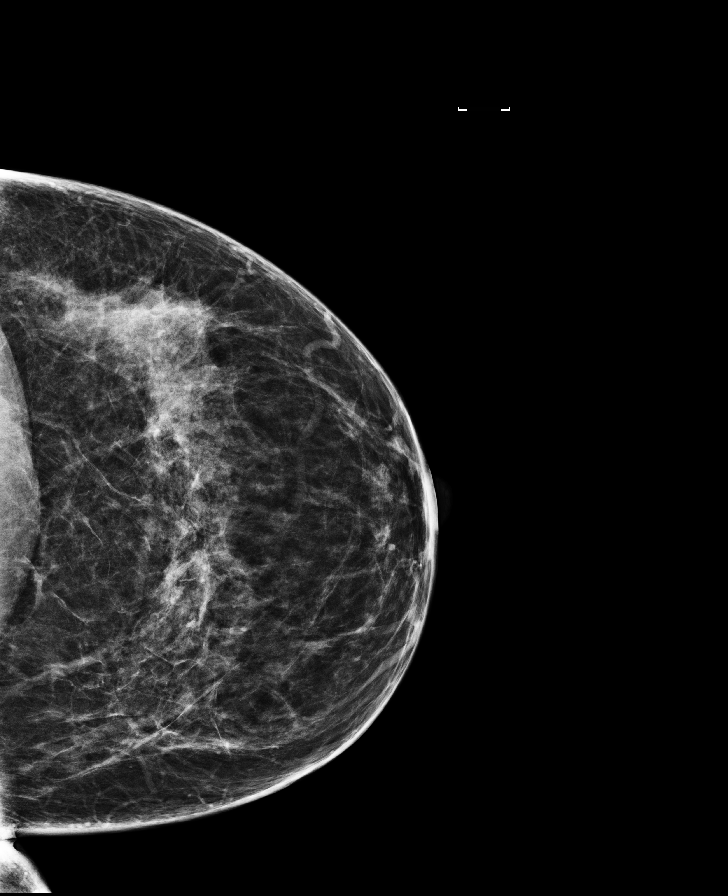

[8 of 28 positions shown; findings below may reference images not displayed]

ACR Breast Density Category c: The breast tissue is heterogeneously
dense, which may obscure small masses.
FINDINGS: There are no findings suspicious for malignancy. Images were
processed with CAD.
IMPRESSION: No mammographic evidence of malignancy. A result letter of this
screening mammogram will be mailed directly to the patient.

RECOMMENDATION:
Screening mammogram in one year. (Code:[id number])

BI-RADS CATEGORY  1: Negative.

## 2018-02-18 ENCOUNTER — Other Ambulatory Visit: Payer: Self-pay | Admitting: Family Medicine

## 2018-02-18 DIAGNOSIS — Z1231 Encounter for screening mammogram for malignant neoplasm of breast: Secondary | ICD-10-CM

## 2018-03-06 ENCOUNTER — Encounter (INDEPENDENT_AMBULATORY_CARE_PROVIDER_SITE_OTHER): Payer: Self-pay

## 2018-03-06 ENCOUNTER — Ambulatory Visit
Admission: RE | Admit: 2018-03-06 | Discharge: 2018-03-06 | Disposition: A | Discharge status: Home / Self Care | Payer: BLUE CROSS/BLUE SHIELD | Source: Ambulatory Visit | Attending: Family Medicine | Admitting: Family Medicine

## 2018-03-06 DIAGNOSIS — Z1231 Encounter for screening mammogram for malignant neoplasm of breast: Secondary | ICD-10-CM | POA: Diagnosis present

## 2018-05-16 ENCOUNTER — Ambulatory Visit
Admission: EM | Admit: 2018-05-16 | Discharge: 2018-05-16 | Disposition: A | Discharge status: Home / Self Care | Payer: BLUE CROSS/BLUE SHIELD | Attending: Family Medicine | Admitting: Family Medicine

## 2018-05-16 DIAGNOSIS — H1132 Conjunctival hemorrhage, left eye: Secondary | ICD-10-CM | POA: Diagnosis not present

## 2018-05-16 DIAGNOSIS — H5789 Other specified disorders of eye and adnexa: Secondary | ICD-10-CM | POA: Diagnosis not present

## 2018-05-16 MED ORDER — KETOROLAC TROMETHAMINE 0.5 % OP SOLN: 1.0000 [drp] | Freq: Four times a day (QID) | OPHTHALMIC | 0 refills | Status: DC

## 2018-05-16 NOTE — ED Provider Notes (Signed)
[facility]    CSN: [id number] Arrival date & time: 05/16/18  1859     History   Chief Complaint Chief Complaint  Patient presents with  . Conjunctivitis    HPI Katherine Berger is a 48 y.o. female.   HPI  Female presents with left eye discomfort started last night.  She noticed a glossiness over her eye and in this morning was very red and more painful.  Like something in her first but is not a problem now.  She is not squinting.  She has no drainage from the eye.  She does not wear contacts.  Is is noticed of the sclera in the lateral aspect.  Acuity was slightly decreased she states because the chart was blurry due to her eye watering.         Past Medical History:  Diagnosis Date  . Hypertension     There are no active problems to display for this patient.   Past Surgical History:  Procedure Laterality Date  . LIPOSUCTION AUOLOGOUS FAT TRANSFER TO BUTTOCKS    . TUBAL LIGATION      OB History   No obstetric history on file.      Home Medications    Prior to Admission medications   Medication Sig Start Date End Date Taking? Authorizing Provider  lisinopril-hydrochlorothiazide (PRINZIDE,ZESTORETIC) 20-12.5 MG tablet Take 1 tablet by mouth daily.   Yes [provider]  ciprofloxacin (CIPRO) 500 MG tablet Take 500 mg by mouth 2 (two) times daily. For 7 days 10/06/15   [provider]  fluconazole (DIFLUCAN) 150 MG tablet Take 150 mg by mouth once. 10/12/15   [provider]  fluconazole (DIFLUCAN) 150 MG tablet Take 1 tablet once for yeast infection 11/29/15   Marylene Land, NP  Hydrocodone-Acetaminophen 7.5-300 MG TABS Take 1 tablet by mouth every 6 (six) hours as needed (for pain).     [provider]  ketorolac (ACULAR) 0.5 % ophthalmic solution Place 1 drop into both eyes every 6 (six) hours. 05/16/18   Lorin Picket, PA-C  metroNIDAZOLE (FLAGYL) 500 MG tablet Take 1 tablet (500 mg total) by mouth 2 (two)  times daily. 11/29/15   Marylene Land, NP    Family History Family History  Problem Relation Age of Onset  . Healthy Mother   . Hypertension Father   . COPD Father   . Breast cancer Neg Hx     Social History Social History   Tobacco Use  . Smoking status: Former Research scientist (life sciences)  . Smokeless tobacco: Never Used  Substance Use Topics  . Alcohol use: Yes  . Drug use: Not on file     Allergies   Patient has no known allergies.   Review of Systems Review of Systems  Constitutional: Negative for activity change, appetite change, chills, diaphoresis, fatigue and fever.  Eyes: Positive for pain, discharge, redness and visual disturbance. Negative for photophobia and itching.  All other systems reviewed and are negative.    Physical Exam Triage Vital Signs ED Triage Vitals  Enc Vitals Group     BP 05/16/18 1915 (!) 142/97     Pulse Rate 05/16/18 1915 90     Resp 05/16/18 1915 18     Temp 05/16/18 1915 98.5 F (36.9 C)     Temp Source 05/16/18 1915 Oral     SpO2 05/16/18 1915 100 %     Weight 05/16/18 1917 180 lb (81.6 kg)     Height 05/16/18 1917 5'  2" (1.575 m)     Head Circumference --      Peak Flow --      Pain Score 05/16/18 1916 6     Pain Loc --      Pain Edu? --      Excl. in [place]? --    No data found.  Updated Vital Signs BP (!) 142/97 (BP Location: Right Arm)   Pulse 90   Temp 98.5 F (36.9 C) (Oral)   Resp 18   Ht 5\' 2"  (1.575 m)   Wt 180 lb (81.6 kg)   LMP 04/22/2018 (Exact Date)   SpO2 100%   BMI 32.92 kg/m   Visual Acuity Right Eye Distance: 20/25 Left Eye Distance: 20/40 Bilateral Distance:    Right Eye Near:   Left Eye Near:    Bilateral Near:     Physical Exam Vitals signs and nursing note reviewed.  Constitutional:      General: She is not in acute distress.    Appearance: Normal appearance. She is not ill-appearing, toxic-appearing or diaphoretic.  HENT:     Head: Normocephalic.     Nose: Nose normal.     Mouth/Throat:      Mouth: Mucous membranes are moist.     Pharynx: Oropharynx is clear.  Eyes:     General:        Left eye: Discharge present.    Extraocular Movements: Extraocular movements intact.     Conjunctiva/sclera: Conjunctivae normal.     Pupils: Pupils are equal, round, and reactive to light.     Comments: Exam of the left eye shows the lid and lashes to be normal.  There is no debris present.  No swelling is present either.  The lateral canthus is also apparently normal.  PERRLA EOMs are intact.  Has injection of the lateral portion of the eye.  There is a small area that seems to be more punctate and more intense than the rest of the superficial vessels.  Acuity is normal.  Neck:     Musculoskeletal: Normal range of motion and neck supple.  Musculoskeletal: Normal range of motion.  Skin:    General: Skin is warm and dry.  Neurological:     General: No focal deficit present.     Mental Status: She is alert and oriented to person, place, and time.  Psychiatric:        Mood and Affect: Mood normal.        Behavior: Behavior normal.        Thought Content: Thought content normal.        Judgment: Judgment normal.      UC Treatments / Results  Labs (all labs ordered are listed, but only abnormal results are displayed) Labs Reviewed - No data to display  EKG None  Radiology No results found.  Procedures Procedures (including critical care time)  Medications Ordered in UC Medications - No data to display  Initial Impression / Assessment and Plan / UC Course  I have reviewed the triage vital signs and the nursing notes.  Pertinent labs & imaging results that were available during my care of the patient were reviewed by me and considered in my medical decision making (see chart for details).   Patient was placed on ketorolac ophthalmic eyedrops for comfort.  Told the patient that we are that this will be self-limiting.  Continue she should go to [facility] on Monday.  Also use cool  compresses on the area for  comfort.   Final Clinical Impressions(s) / UC Diagnoses   Final diagnoses:  Irritation of left eye  Subconjunctival bleed, left   Discharge Instructions   None    ED Prescriptions    Medication Sig Dispense Auth. Provider   ketorolac (ACULAR) 0.5 % ophthalmic solution Place 1 drop into both eyes every 6 (six) hours. 5 mL Lorin Picket, PA-C     Controlled Substance Prescriptions [place] Controlled Substance Registry consulted? Not Applicable   Lorin Picket, PA-C 05/16/18 1952

## 2018-05-16 NOTE — ED Triage Notes (Signed)
Pt here for left eye pain that started last night and is now red and painful. States it feels as if something is in her ear. Redness on the left side of her left eye. No otc meds tried.

## 2018-09-19 ENCOUNTER — Other Ambulatory Visit (HOSPITAL_COMMUNITY): Payer: Self-pay | Admitting: Family Medicine

## 2018-09-19 ENCOUNTER — Other Ambulatory Visit: Payer: Self-pay | Admitting: Family Medicine

## 2018-09-19 DIAGNOSIS — N938 Other specified abnormal uterine and vaginal bleeding: Secondary | ICD-10-CM

## 2018-09-20 ENCOUNTER — Other Ambulatory Visit: Payer: Self-pay

## 2018-09-20 ENCOUNTER — Ambulatory Visit
Admission: RE | Admit: 2018-09-20 | Discharge: 2018-09-20 | Disposition: A | Discharge status: Home / Self Care | Payer: BC Managed Care – PPO | Source: Ambulatory Visit | Attending: Family Medicine | Admitting: Family Medicine

## 2018-09-20 DIAGNOSIS — N938 Other specified abnormal uterine and vaginal bleeding: Secondary | ICD-10-CM | POA: Insufficient documentation

## 2018-10-18 NOTE — H&P (Signed)
Chief Complaint:   Katherine Berger is a 48 y.o.G6P2012 female here for Endo BX and Gynecologic Exam  Body mass index is 32.12 kg/m.  History of Present Illness: -Patient presents for preoperative clearance and signing consent forms for her scheduled Total Laparoscopic Hysterectomy with Bilateral Salpingectomy. Indications are Abnormal Uterine Bleeding and Uterine Fibroid.   Patient is very anxious and worried about her AUB and has decided on definitive, surgical intervention.  Treatment: -Lysteda; stopped bleeding for a few days but bleeding returned -OCPs; slowing down bleeding slightly but bleeding still extremely bothersome to patient -Iron supplements for anemia  Work Up 09/2018: TVUS -Ut 13.4 x 10.1 x 7.8 cm = 533 mL, Fibroid submucosal measuring 6.4 x 5.7 x 5.5 no additional uterine masses Endo thickness: 6 mm RO = 6.2 x 5.0 x 4.0 cm = 64.7 mL; large simple cyst LO = 3.4 x 2.6 x 2.5 cm = 11.6 mL  Labs -TSH & PRL wnl, CBC: HgB 10.4    Pertinent Hx: -Menarche at 48yo -Smoker -Frequently sexually active with her spouse and is worried about the post-op period in regards to sexual activity  Surgical Hx: -BTL  Past Medical History:  has a past medical history of BV (bacterial vaginosis), Essential hypertension (03/29/2016), GERD (gastroesophageal reflux disease), 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  Past Surgical History:  has a past surgical history that includes Tubal ligation. Family History: family history includes Cancer in her paternal aunt; Cirrhosis in her maternal grandfather; Diabetes type II in her maternal grandmother; High blood pressure (Hypertension) in her father, maternal aunt, maternal grandmother, mother, sister, and sister; Stroke in her father and sister; Throat cancer in her father. Social History:  reports that she has been smoking cigarettes. She has  a 6.00 pack-year smoking history. She has never used smokeless tobacco. She reports current alcohol use. She reports that she does not use drugs. OB/GYN History:  OB History    Gravida  3   Para  2   Term  2   Preterm      AB  1   Living  2     SAB      TAB      Ectopic      Molar      Multiple      Live Births           Allergies: has No Known Allergies. Medications: Current Outpatient Medications:  .  aspirin 81 MG EC tablet, Take 81 mg by mouth once daily., Disp: , Rfl:  .  cetirizine (ZYRTEC) 10 MG tablet, Take 1 tablet (10 mg total) by mouth once daily, Disp: 30 tablet, Rfl: 11 .  docusate (COLACE) 100 MG capsule, Take by mouth Take 100 mg by mouth daily, Disp: , Rfl:  .  ferrous sulfate 325 (65 FE) MG tablet, Take 325 mg by mouth daily with breakfast, Disp: , Rfl:  .  fluticasone propionate (FLONASE) 50 mcg/actuation nasal spray, by Nasal route  Place 2 sprays into both nostrils daily as needed for allergies., Disp: , Rfl:  .  lisinopril-hydrochlorothiazide (PRINZIDE,ZESTORETIC) 20-12.5 mg tablet, Take 1 tablet by mouth once daily, Disp: 30 tablet, Rfl: 11 .  norethindrone (MICRONOR) 0.35 mg tablet, Take 1 tablet (0.35 mg total) by mouth once daily DO NOT FILL UNTIL 6/29, Disp: 3 Package, Rfl: 4 .  potassium gluconate 2.5 mEq Tab, Take by mouth Take 99 mg by mouth daily, Disp: , Rfl:  .  POTASSIUM ORAL, Take by mouth., Disp: , Rfl:  .  tranexamic acid (LYSTEDA) 650 mg tablet, Take 2 tablets (1,300 mg total) by mouth 3 (three) times daily Take for a maximum of 5 days during monthly menstruation. (Patient not taking: Reported on 10/11/2018  ), Disp: 30 tablet, Rfl: 0  Review of Systems: No SOB, no palpitations or chest pain, no new lower extremity edema, no nausea or vomiting or bowel or bladder complaints. See HPI for gyn specific ROS.   Exam:   BP 130/82   Pulse (!) 114   Ht 157.5 cm (5\' 2" )   Wt 79.7 kg (175 lb 9.6 oz)   LMP 10/11/2018   BMI 32.12 kg/m    Constitutional:  General appearance: Well nourished, well developed female in no acute distress.  Neuro/psych:  Patient very anxious about her AUB. Neck:  Supple, normal appearance.  Respiratory:  Normal respiratory effort, no use of accessory muscles, CTAB CV: RRR Skin:  No visible rashes or external lesions  Pelvic:   External genitalia: vulva and labia without lesions, Tanner stage 5  Urethra: no prolapse  Vagina: normal physiologic d/c  Cervix: no lesions, no cervical motion tenderness    Uterus: 15wk sized, firm and smooth, anterior  Adnexa: no mass,  non-tender    Rectovaginal: external exam normal  Pap smear collected today  Endometrial biopsy: The cervix was cleaned with betadine, topical Hurriciane spray applied, and a single tooth tenaculum is applied to the anterior cervix. The Pipelle catheter was placed into the endometrial cavity. However, I encountered resistance at the internal os, which was open and did not require dilation. She was very anxious through the whole procedure. No hx of abuse.  Impression:   The primary encounter diagnosis was Abnormal uterine bleeding (AUB), unspecified. Diagnoses of Submucous leiomyoma of uterus, Preoperative evaluation to rule out surgical contraindication, and Papanicolaou smear were also pertinent to this visit.  Plan:   1. Pre-Operative Clearance  -Endometrial Biopsy today attempted but not successful -Pap smear collected today -Patient returns for a decision discussion regarding her plans to proceed with surgical treatment of her abnormal  Uterine bleeding and Uterine Fibroid. We had discussed myomectomy with endometrial ablation. However, she ultimately decided on exam under anesthesia, total laparoscopic hysterectomy with bilateral salpingectomy procedure.  We will perform a cystoscopy to evaluate the urinary tract after the procedure if needed.  -The patient and I discussed the technical aspects of the procedure including  the potential for risks and complications.  These include but are not limited to the risk of infection requiring post-operative antibiotics or further procedures.  We talked about the risk of injury to adjacent organs including bladder, bowel, ureter, blood vessels or nerves.  We talked about the need to convert to an open incision.  We talked about the possible need for blood transfusion.  We talked about postop complications such as thromboembolic or cardiopulmonary complications.  All of her questions were answered.    -Her preoperative exam was completed and the appropriate consents were signed. Patient is anxious but is wanting this decision. -She is scheduled to undergo this procedure on 10/25/2018.  Specific Peri-operative Considerations:  - Consent: obtained today - Health Maintenance: pap collected today - Labs: CBC, CMP preoperatively - Studies: EKG, CXR preoperatively - Bowel Preparation: None required - Abx:  Ancef 2g - VTE ppx: SCDs perioperatively - Glucose Protocol: no - Beta-blockade: no   Diagnoses and all orders for this visit:  Abnormal uterine bleeding (AUB), unspecified -  PapIG, HPV, rfx 16/18 - [facility]  Submucous leiomyoma of uterus  Preoperative evaluation to rule out surgical contraindication -     PapIG, HPV, rfx 16/18 - [facility]  Papanicolaou smear -     PapIG, HPV, rfx 16/18 - [facility]     Return for Postop check.  ~~~~~~~~~~~~~~~~~~~~~~~~~~~~~~~~~~~~~~~~~~~~~~~~~~~~~~~~~~~~ This note is partially written by Priscella Mann, in the presence of and acting as the scribe of Dr. Benjaman Kindler, who has reviewed, edited and added to the note to reflect her best personal medical judgment.  This note was generated in part with voice recognition software and I apologize for any typographical errors that were not detected and corrected.  Sherrie George, MD

## 2018-10-23 ENCOUNTER — Encounter
Admission: RE | Admit: 2018-10-23 | Discharge: 2018-10-23 | Disposition: A | Discharge status: Home / Self Care | Payer: BC Managed Care – PPO | Source: Ambulatory Visit | Attending: Obstetrics and Gynecology | Admitting: Obstetrics and Gynecology

## 2018-10-23 ENCOUNTER — Other Ambulatory Visit: Payer: Self-pay

## 2018-10-23 DIAGNOSIS — Z01818 Encounter for other preprocedural examination: Secondary | ICD-10-CM | POA: Diagnosis not present

## 2018-10-23 DIAGNOSIS — Z1159 Encounter for screening for other viral diseases: Secondary | ICD-10-CM | POA: Diagnosis not present

## 2018-10-23 HISTORY — DX: Gastro-esophageal reflux disease without esophagitis: K21.9

## 2018-10-23 HISTORY — DX: Anemia, unspecified: D64.9

## 2018-10-23 LAB — SARS CORONAVIRUS 2 (TAT 6-24 HRS): SARS Coronavirus 2: NEGATIVE

## 2018-10-23 LAB — CBC
HCT: 29.7 % — ABNORMAL LOW (ref 36.0–46.0)
Hemoglobin: 8.9 g/dL — ABNORMAL LOW (ref 12.0–15.0)
MCH: 23.9 pg — ABNORMAL LOW (ref 26.0–34.0)
MCHC: 30 g/dL (ref 30.0–36.0)
MCV: 79.6 fL — ABNORMAL LOW (ref 80.0–100.0)
Platelets: 328 10*3/uL (ref 150–400)
RBC: 3.73 MIL/uL — ABNORMAL LOW (ref 3.87–5.11)
RDW: 23.8 % — ABNORMAL HIGH (ref 11.5–15.5)
WBC: 7.6 10*3/uL (ref 4.0–10.5)
nRBC: 0.3 % — ABNORMAL HIGH (ref 0.0–0.2)

## 2018-10-23 LAB — BASIC METABOLIC PANEL
Anion gap: 8 (ref 5–15)
BUN: 15 mg/dL (ref 6–20)
CO2: 24 mmol/L (ref 22–32)
Calcium: 8.6 mg/dL — ABNORMAL LOW (ref 8.9–10.3)
Chloride: 105 mmol/L (ref 98–111)
Creatinine, Ser: 0.8 mg/dL (ref 0.44–1.00)
GFR calc Af Amer: >60 mL/min (ref >60)
GFR calc non Af Amer: >60 mL/min (ref >60)
Glucose, Bld: 83 mg/dL (ref 70–99)
Potassium: 3.9 mmol/L (ref 3.5–5.1)
Sodium: 137 mmol/L (ref 135–145)

## 2018-10-23 NOTE — Patient Instructions (Signed)
Your procedure is scheduled on: 10/25/18 Report to Day Surgery. [facility]OND [facility]LO[facility] To find out your arrival time please call [phone] between 1PM - 3PM on 10/24/18.  Remember: Instructions that are not followed completely may result in serious medical risk,  up to and including death, or upon the discretion of your surgeon and anesthesiologist your  surgery may need to be rescheduled.     _X__ 1. Do not eat food after midnight the night before your procedure.                 No gum chewing or hard candies. You may drink clear liquids up to 2 hours                 before you are scheduled to arrive for your surgery- DO not drink clear                 liquids within 2 hours of the start of your surgery.                 Clear Liquids include:  water, apple juice without pulp, clear carbohydrate                 drink such as Clearfast of Gatorade, Black Coffee or Tea (Do not add                 anything to coffee or tea). FINISH CARB DRINK 3 FULL HOURS BEFORE ARRIVAL DAY OF SURGERY  __X__2.  On the morning of surgery brush your teeth with toothpaste and water, you                may rinse your mouth with mouthwash if you wish.  Do not swallow any toothpaste of mouthwash.     _X__ 3.  No Alcohol for 24 hours before or after surgery.   _X__ 4.  Do Not Smoke or use e-cigarettes For 24 Hours Prior to Your Surgery.                 Do not use any chewable tobacco products for at least 6 hours prior to                 surgery.  ____  5.  Bring all medications with you on the day of surgery if instructed.   __X__  6.  Notify your doctor if there is any change in your medical condition      (cold, fever, infections).     Do not wear jewelry, make-up, hairpins, clips or nail polish. Do not wear lotions, powders, or perfumes. You may wear deodorant. Do not shave 48 hours prior to surgery. Men may shave face and neck. Do not bring valuables to the hospital.     [facility] is not responsible for any belongings or valuables.  Contacts, dentures or bridgework may not be worn into surgery. Leave your suitcase in the car. After surgery it may be brought to your room. For patients admitted to the hospital, discharge time is determined by your treatment team.   Patients discharged the day of surgery will not be allowed to drive home.   Please read over the following fact sheets that you were given:   Surgical Site Infection Prevention / SPIROMETRY          ___ Take these medicines the morning of surgery with A SIP OF WATER:    1.   2.   3.  4.  5.  6.  ____ Fleet Enema (as directed)   _X___ Use CHG Soap as directed  ____ Use inhalers on the day of surgery  ____ Stop metformin 2 days prior to surgery    ____ Take 1/2 of usual insulin dose the night before surgery. No insulin the morning          of surgery.   _X___ Stop Coumadin/Plavix/aspirin on   NO MORE ASPIRIN UNTIL AFTER SURGERY  __X__ Stop Anti-inflammatories on   STOP IBUPROFEN AS INSTRUCTED BY DR BEASLEY   ____ Stop supplements until after surgery.    ____ Bring C-Pap to the hospital.

## 2018-10-24 MED ORDER — CEFAZOLIN SODIUM-DEXTROSE 2-4 GM/100ML-% IV SOLN
2.0000 g | INTRAVENOUS | Status: AC
  Administered 2018-10-25: 2 g via INTRAVENOUS

## 2018-10-25 ENCOUNTER — Ambulatory Visit: Payer: BC Managed Care – PPO | Admitting: Anesthesiology

## 2018-10-25 ENCOUNTER — Other Ambulatory Visit: Payer: Self-pay

## 2018-10-25 ENCOUNTER — Encounter: Payer: Self-pay | Admitting: *Deleted

## 2018-10-25 ENCOUNTER — Encounter
Admission: AD | Disposition: A | Discharge status: Home / Self Care | Payer: Self-pay | Source: Home / Self Care | Attending: Obstetrics and Gynecology

## 2018-10-25 ENCOUNTER — Inpatient Hospital Stay
Admission: AD | Admit: 2018-10-25 | Discharge: 2018-10-29 | DRG: 742 | Disposition: A | Discharge status: Home / Self Care | Payer: BC Managed Care – PPO | Attending: Obstetrics and Gynecology | Admitting: Obstetrics and Gynecology

## 2018-10-25 DIAGNOSIS — D250 Submucous leiomyoma of uterus: Secondary | ICD-10-CM | POA: Diagnosis present

## 2018-10-25 DIAGNOSIS — D62 Acute posthemorrhagic anemia: Secondary | ICD-10-CM | POA: Diagnosis not present

## 2018-10-25 DIAGNOSIS — N920 Excessive and frequent menstruation with regular cycle: Secondary | ICD-10-CM | POA: Diagnosis present

## 2018-10-25 DIAGNOSIS — Z8673 Personal history of transient ischemic attack (TIA), and cerebral infarction without residual deficits: Secondary | ICD-10-CM

## 2018-10-25 DIAGNOSIS — E669 Obesity, unspecified: Secondary | ICD-10-CM | POA: Diagnosis present

## 2018-10-25 DIAGNOSIS — I10 Essential (primary) hypertension: Secondary | ICD-10-CM | POA: Diagnosis present

## 2018-10-25 DIAGNOSIS — Z9851 Tubal ligation status: Secondary | ICD-10-CM | POA: Diagnosis not present

## 2018-10-25 DIAGNOSIS — Z6830 Body mass index (BMI) 30.0-30.9, adult: Secondary | ICD-10-CM | POA: Diagnosis not present

## 2018-10-25 DIAGNOSIS — Z5331 Laparoscopic surgical procedure converted to open procedure: Secondary | ICD-10-CM | POA: Diagnosis not present

## 2018-10-25 DIAGNOSIS — F17210 Nicotine dependence, cigarettes, uncomplicated: Secondary | ICD-10-CM | POA: Diagnosis present

## 2018-10-25 DIAGNOSIS — D251 Intramural leiomyoma of uterus: Secondary | ICD-10-CM | POA: Diagnosis present

## 2018-10-25 HISTORY — PX: LAPAROSCOPIC HYSTERECTOMY: SHX1926

## 2018-10-25 LAB — POCT PREGNANCY, URINE: Preg Test, Ur: NEGATIVE

## 2018-10-25 SURGERY — HYSTERECTOMY, TOTAL, LAPAROSCOPIC
Anesthesia: General | Site: Abdomen

## 2018-10-25 MED ORDER — PROPOFOL 10 MG/ML IV BOLUS
INTRAVENOUS | Status: DC | PRN
  Administered 2018-10-25: 170 mg via INTRAVENOUS

## 2018-10-25 MED ORDER — SODIUM CHLORIDE 0.9% FLUSH: 9.0000 mL | INTRAVENOUS | Status: DC | PRN

## 2018-10-25 MED ORDER — CEFAZOLIN SODIUM-DEXTROSE 2-4 GM/100ML-% IV SOLN
INTRAVENOUS | Status: AC
  Filled 2018-10-25: qty 100

## 2018-10-25 MED ORDER — LACTATED RINGERS IV SOLN
INTRAVENOUS | Status: DC
  Administered 2018-10-25: 13:00:00 via INTRAVENOUS

## 2018-10-25 MED ORDER — TRANEXAMIC ACID 1000 MG/10ML IV SOLN
INTRAVENOUS | Status: AC
  Filled 2018-10-25: qty 10

## 2018-10-25 MED ORDER — SUCCINYLCHOLINE CHLORIDE 20 MG/ML IJ SOLN
INTRAMUSCULAR | Status: DC | PRN
  Administered 2018-10-25: 120 mg via INTRAVENOUS

## 2018-10-25 MED ORDER — LISINOPRIL 20 MG PO TABS
20.0000 mg | ORAL_TABLET | Freq: Every day | ORAL | Status: DC
  Administered 2018-10-25 – 2018-10-29 (×3): 20 mg via ORAL
  Filled 2018-10-25 (×5): qty 1

## 2018-10-25 MED ORDER — FENTANYL CITRATE (PF) 100 MCG/2ML IJ SOLN
INTRAMUSCULAR | Status: DC | PRN
  Administered 2018-10-25 (×5): 50 ug via INTRAVENOUS

## 2018-10-25 MED ORDER — BUPIVACAINE LIPOSOME 1.3 % IJ SUSP
INTRAMUSCULAR | Status: AC
  Filled 2018-10-25: qty 20

## 2018-10-25 MED ORDER — MIDAZOLAM HCL 2 MG/2ML IJ SOLN
INTRAMUSCULAR | Status: DC | PRN
  Administered 2018-10-25: 2 mg via INTRAVENOUS

## 2018-10-25 MED ORDER — FENTANYL CITRATE (PF) 100 MCG/2ML IJ SOLN
INTRAMUSCULAR | Status: AC
  Administered 2018-10-25: 17:00:00 25 ug via INTRAVENOUS
  Filled 2018-10-25: qty 2

## 2018-10-25 MED ORDER — DIPHENHYDRAMINE HCL 50 MG/ML IJ SOLN: 12.5000 mg | Freq: Four times a day (QID) | INTRAMUSCULAR | Status: DC | PRN

## 2018-10-25 MED ORDER — HYDROCHLOROTHIAZIDE 12.5 MG PO CAPS
12.5000 mg | ORAL_CAPSULE | Freq: Every day | ORAL | Status: DC
  Administered 2018-10-25 – 2018-10-29 (×3): 12.5 mg via ORAL
  Filled 2018-10-25 (×5): qty 1

## 2018-10-25 MED ORDER — SIMETHICONE 80 MG PO CHEW
80.0000 mg | CHEWABLE_TABLET | Freq: Four times a day (QID) | ORAL | Status: DC | PRN
  Administered 2018-10-27 – 2018-10-28 (×4): 80 mg via ORAL
  Filled 2018-10-25 (×4): qty 1

## 2018-10-25 MED ORDER — DIPHENHYDRAMINE HCL 12.5 MG/5ML PO ELIX
12.5000 mg | ORAL_SOLUTION | Freq: Four times a day (QID) | ORAL | Status: DC | PRN
  Filled 2018-10-25: qty 5

## 2018-10-25 MED ORDER — GABAPENTIN 400 MG PO CAPS
400.0000 mg | ORAL_CAPSULE | Freq: Every day | ORAL | Status: DC
  Administered 2018-10-25 – 2018-10-28 (×4): 400 mg via ORAL
  Filled 2018-10-25 (×3): qty 1
  Filled 2018-10-25 (×2): qty 4
  Filled 2018-10-25: qty 1
  Filled 2018-10-25: qty 4
  Filled 2018-10-25: qty 1
  Filled 2018-10-25: qty 4

## 2018-10-25 MED ORDER — SEVOFLURANE IN SOLN
RESPIRATORY_TRACT | Status: AC
  Filled 2018-10-25: qty 250

## 2018-10-25 MED ORDER — MIDAZOLAM HCL 2 MG/2ML IJ SOLN
INTRAMUSCULAR | Status: AC
  Filled 2018-10-25: qty 2

## 2018-10-25 MED ORDER — HYDROMORPHONE HCL 1 MG/ML IJ SOLN
1.0000 mg | INTRAMUSCULAR | Status: DC | PRN
  Administered 2018-10-25: 1 mg via INTRAVENOUS
  Administered 2018-10-26 – 2018-10-27 (×2): 2 mg via INTRAVENOUS
  Filled 2018-10-25: qty 1
  Filled 2018-10-25 (×2): qty 2

## 2018-10-25 MED ORDER — KETOROLAC TROMETHAMINE 30 MG/ML IJ SOLN
30.0000 mg | Freq: Once | INTRAMUSCULAR | Status: AC
  Administered 2018-10-25: 17:00:00 30 mg via INTRAVENOUS

## 2018-10-25 MED ORDER — FLUTICASONE PROPIONATE 50 MCG/ACT NA SUSP
2.0000 | Freq: Every day | NASAL | Status: DC | PRN
  Filled 2018-10-25: qty 16

## 2018-10-25 MED ORDER — HYDROMORPHONE HCL 1 MG/ML IJ SOLN
INTRAMUSCULAR | Status: AC
  Administered 2018-10-25: 18:00:00 0.5 mg via INTRAVENOUS
  Filled 2018-10-25: qty 1

## 2018-10-25 MED ORDER — SUGAMMADEX SODIUM 500 MG/5ML IV SOLN
INTRAVENOUS | Status: DC | PRN
  Administered 2018-10-25: 300 mg via INTRAVENOUS

## 2018-10-25 MED ORDER — HYDROMORPHONE 1 MG/ML IV SOLN
INTRAVENOUS | Status: DC
  Administered 2018-10-25: 25 mg via INTRAVENOUS
  Administered 2018-10-26: 1.5 mg via INTRAVENOUS
  Administered 2018-10-26: 2.6 mg via INTRAVENOUS
  Administered 2018-10-26: 2.4 mg via INTRAVENOUS
  Filled 2018-10-25 (×6): qty 30

## 2018-10-25 MED ORDER — KETOROLAC TROMETHAMINE 30 MG/ML IJ SOLN
INTRAMUSCULAR | Status: AC
  Administered 2018-10-25: 30 mg via INTRAVENOUS
  Filled 2018-10-25: qty 1

## 2018-10-25 MED ORDER — IBUPROFEN 800 MG PO TABS
800.0000 mg | ORAL_TABLET | Freq: Four times a day (QID) | ORAL | Status: DC
  Administered 2018-10-26 – 2018-10-29 (×11): 800 mg via ORAL
  Filled 2018-10-25 (×11): qty 1

## 2018-10-25 MED ORDER — SODIUM CHLORIDE FLUSH 0.9 % IV SOLN
INTRAVENOUS | Status: AC
  Filled 2018-10-25: qty 40

## 2018-10-25 MED ORDER — LACTATED RINGERS IV SOLN
INTRAVENOUS | Status: DC
  Administered 2018-10-25 – 2018-10-26 (×5): via INTRAVENOUS

## 2018-10-25 MED ORDER — ROCURONIUM BROMIDE 100 MG/10ML IV SOLN
INTRAVENOUS | Status: DC | PRN
  Administered 2018-10-25: 10 mg via INTRAVENOUS
  Administered 2018-10-25: 5 mg via INTRAVENOUS
  Administered 2018-10-25: 25 mg via INTRAVENOUS
  Administered 2018-10-25 (×2): 10 mg via INTRAVENOUS
  Administered 2018-10-25: 20 mg via INTRAVENOUS

## 2018-10-25 MED ORDER — FENTANYL CITRATE (PF) 250 MCG/5ML IJ SOLN
INTRAMUSCULAR | Status: AC
  Filled 2018-10-25: qty 5

## 2018-10-25 MED ORDER — SOD CITRATE-CITRIC ACID 500-334 MG/5ML PO SOLN
30.0000 mL | ORAL | Status: DC
  Filled 2018-10-25: qty 30

## 2018-10-25 MED ORDER — FAMOTIDINE 20 MG PO TABS
ORAL_TABLET | ORAL | Status: AC
  Administered 2018-10-25: 11:00:00 20 mg via ORAL
  Filled 2018-10-25: qty 1

## 2018-10-25 MED ORDER — MENTHOL 3 MG MT LOZG
1.0000 | LOZENGE | OROMUCOSAL | Status: DC | PRN
  Filled 2018-10-25: qty 9

## 2018-10-25 MED ORDER — DOCUSATE SODIUM 100 MG PO CAPS
100.0000 mg | ORAL_CAPSULE | Freq: Every day | ORAL | Status: DC
  Administered 2018-10-26 – 2018-10-29 (×4): 100 mg via ORAL
  Filled 2018-10-25 (×4): qty 1

## 2018-10-25 MED ORDER — LIDOCAINE HCL (CARDIAC) PF 100 MG/5ML IV SOSY
PREFILLED_SYRINGE | INTRAVENOUS | Status: DC | PRN
  Administered 2018-10-25: 60 mg via INTRAVENOUS

## 2018-10-25 MED ORDER — SODIUM CHLORIDE 0.9 % IV SOLN
INTRAVENOUS | Status: DC | PRN
  Administered 2018-10-25: 60 mL

## 2018-10-25 MED ORDER — POTASSIUM 99 MG PO TABS: 99.0000 mg | ORAL_TABLET | Freq: Every day | ORAL | Status: DC

## 2018-10-25 MED ORDER — ESMOLOL HCL 100 MG/10ML IV SOLN
INTRAVENOUS | Status: DC | PRN
  Administered 2018-10-25: 30 mg via INTRAVENOUS
  Administered 2018-10-25: 10 mg via INTRAVENOUS

## 2018-10-25 MED ORDER — KETOROLAC TROMETHAMINE 30 MG/ML IJ SOLN
30.0000 mg | Freq: Four times a day (QID) | INTRAMUSCULAR | Status: AC
  Administered 2018-10-25 – 2018-10-26 (×4): 30 mg via INTRAVENOUS
  Filled 2018-10-25 (×4): qty 1

## 2018-10-25 MED ORDER — GABAPENTIN 300 MG PO CAPS
ORAL_CAPSULE | ORAL | Status: AC
  Administered 2018-10-25: 11:00:00 300 mg via ORAL
  Filled 2018-10-25: qty 1

## 2018-10-25 MED ORDER — FENTANYL CITRATE (PF) 100 MCG/2ML IJ SOLN
INTRAMUSCULAR | Status: AC
  Administered 2018-10-25: 25 ug via INTRAVENOUS
  Filled 2018-10-25: qty 2

## 2018-10-25 MED ORDER — DEXMEDETOMIDINE HCL IN NACL 200 MCG/50ML IV SOLN
INTRAVENOUS | Status: DC | PRN
  Administered 2018-10-25 (×2): 8 ug via INTRAVENOUS

## 2018-10-25 MED ORDER — LORATADINE 10 MG PO TABS
10.0000 mg | ORAL_TABLET | Freq: Every day | ORAL | Status: DC
  Administered 2018-10-26 – 2018-10-29 (×4): 10 mg via ORAL
  Filled 2018-10-25 (×5): qty 1

## 2018-10-25 MED ORDER — LORAZEPAM 2 MG/ML IJ SOLN: 1.0000 mg | Freq: Four times a day (QID) | INTRAMUSCULAR | Status: DC | PRN

## 2018-10-25 MED ORDER — NALOXONE HCL 0.4 MG/ML IJ SOLN: 0.4000 mg | INTRAMUSCULAR | Status: DC | PRN

## 2018-10-25 MED ORDER — TRANEXAMIC ACID 1000 MG/10ML IV SOLN
INTRAVENOUS | Status: DC | PRN
  Administered 2018-10-25: 1000 mg via INTRAVENOUS

## 2018-10-25 MED ORDER — ONDANSETRON HCL 4 MG/2ML IJ SOLN: 4.0000 mg | Freq: Once | INTRAMUSCULAR | Status: DC | PRN

## 2018-10-25 MED ORDER — GABAPENTIN 300 MG PO CAPS
300.0000 mg | ORAL_CAPSULE | ORAL | Status: AC
  Administered 2018-10-25: 11:00:00 300 mg via ORAL

## 2018-10-25 MED ORDER — ACETAMINOPHEN 500 MG PO TABS
1000.0000 mg | ORAL_TABLET | ORAL | Status: AC
  Administered 2018-10-25: 11:00:00 1000 mg via ORAL

## 2018-10-25 MED ORDER — FAMOTIDINE 20 MG PO TABS
20.0000 mg | ORAL_TABLET | Freq: Once | ORAL | Status: AC
  Administered 2018-10-25: 11:00:00 20 mg via ORAL

## 2018-10-25 MED ORDER — FERROUS SULFATE 325 (65 FE) MG PO TABS
325.0000 mg | ORAL_TABLET | Freq: Every day | ORAL | Status: DC
  Administered 2018-10-26 – 2018-10-29 (×4): 325 mg via ORAL
  Filled 2018-10-25 (×4): qty 1

## 2018-10-25 MED ORDER — FENTANYL CITRATE (PF) 100 MCG/2ML IJ SOLN
25.0000 ug | INTRAMUSCULAR | Status: AC | PRN
  Administered 2018-10-25 (×6): 25 ug via INTRAVENOUS

## 2018-10-25 MED ORDER — LACTATED RINGERS IV SOLN
INTRAVENOUS | Status: DC
  Administered 2018-10-25: 12:00:00 via INTRAVENOUS

## 2018-10-25 MED ORDER — BUPIVACAINE HCL 0.5 % IJ SOLN
INTRAMUSCULAR | Status: DC | PRN
  Administered 2018-10-25 (×2): 15 mL

## 2018-10-25 MED ORDER — ACETAMINOPHEN 500 MG PO TABS
ORAL_TABLET | ORAL | Status: AC
  Administered 2018-10-25: 11:00:00 1000 mg via ORAL
  Filled 2018-10-25: qty 2

## 2018-10-25 MED ORDER — DEXAMETHASONE SODIUM PHOSPHATE 10 MG/ML IJ SOLN
INTRAMUSCULAR | Status: DC | PRN
  Administered 2018-10-25: 10 mg via INTRAVENOUS

## 2018-10-25 MED ORDER — ONDANSETRON HCL 4 MG/2ML IJ SOLN: 4.0000 mg | Freq: Four times a day (QID) | INTRAMUSCULAR | Status: DC | PRN

## 2018-10-25 MED ORDER — PHENYLEPHRINE HCL (PRESSORS) 10 MG/ML IV SOLN
INTRAVENOUS | Status: DC | PRN
  Administered 2018-10-25 (×2): 100 ug via INTRAVENOUS

## 2018-10-25 MED ORDER — HYDROMORPHONE HCL 1 MG/ML IJ SOLN
0.5000 mg | INTRAMUSCULAR | Status: DC | PRN
  Administered 2018-10-25 (×3): 0.5 mg via INTRAVENOUS

## 2018-10-25 MED ORDER — LISINOPRIL-HYDROCHLOROTHIAZIDE 20-12.5 MG PO TABS: 1.0000 | ORAL_TABLET | Freq: Every day | ORAL | Status: DC

## 2018-10-25 SURGICAL SUPPLY — 70 items
BAG URINE DRAINAGE (UROLOGICAL SUPPLIES) ×4 IMPLANT
BLADE SURG SZ11 CARB STEEL (BLADE) ×2 IMPLANT
CATH FOLEY 2WAY  5CC 16FR (CATHETERS) ×1
CATH URTH 16FR FL 2W BLN LF (CATHETERS) ×1 IMPLANT
CHLORAPREP W/TINT 26 (MISCELLANEOUS) ×2 IMPLANT
CNTNR SPEC 2.5X3XGRAD LEK (MISCELLANEOUS) ×1
CONT SPEC 4OZ STER OR WHT (MISCELLANEOUS) ×1
CONTAINER SPEC 2.5X3XGRAD LEK (MISCELLANEOUS) IMPLANT
CORD MONOPOLAR M/FML 12FT (MISCELLANEOUS) ×2 IMPLANT
COUNTER NEEDLE 20/40 LG (NEEDLE) ×2 IMPLANT
COVER LIGHT HANDLE STERIS (MISCELLANEOUS) ×4 IMPLANT
COVER WAND RF STERILE (DRAPES) ×2 IMPLANT
DERMABOND ADVANCED (GAUZE/BANDAGES/DRESSINGS) ×1
DERMABOND ADVANCED .7 DNX12 (GAUZE/BANDAGES/DRESSINGS) ×1 IMPLANT
DEVICE SUTURE ENDOST 10MM (ENDOMECHANICALS) IMPLANT
DRAPE GENERAL ENDO 106X123.5 (DRAPES) ×2 IMPLANT
DRAPE LEGGINS SURG 28X43 STRL (DRAPES) ×2 IMPLANT
DRAPE STERI POUCH LG 24X46 STR (DRAPES) ×2 IMPLANT
DRAPE UNDER BUTTOCK W/FLU (DRAPES) ×2 IMPLANT
DRSG TEGADERM 2-3/8X2-3/4 SM (GAUZE/BANDAGES/DRESSINGS) ×6 IMPLANT
ELECT CAUTERY BLADE TIP 2.5 (TIP) ×4
ELECTRODE CAUTERY BLDE TIP 2.5 (TIP) IMPLANT
GAUZE SPONGE 4X4 12PLY STRL (GAUZE/BANDAGES/DRESSINGS) ×1 IMPLANT
GLOVE BIO SURGEON STRL SZ7 (GLOVE) ×6 IMPLANT
GLOVE INDICATOR 7.5 STRL GRN (GLOVE) ×2 IMPLANT
GOWN STRL REUS W/ TWL LRG LVL3 (GOWN DISPOSABLE) ×2 IMPLANT
GOWN STRL REUS W/ TWL XL LVL3 (GOWN DISPOSABLE) ×1 IMPLANT
GOWN STRL REUS W/TWL LRG LVL3 (GOWN DISPOSABLE) ×2
GOWN STRL REUS W/TWL XL LVL3 (GOWN DISPOSABLE) ×1
GRASPER SUT TROCAR 14GX15 (MISCELLANEOUS) ×2 IMPLANT
HANDLE YANKAUER SUCT BULB TIP (MISCELLANEOUS) ×2 IMPLANT
IRRIGATION STRYKERFLOW (MISCELLANEOUS) IMPLANT
IRRIGATOR STRYKERFLOW (MISCELLANEOUS) ×2
KIT PINK PAD W/HEAD ARE REST (MISCELLANEOUS) ×2
KIT PINK PAD W/HEAD ARM REST (MISCELLANEOUS) ×1 IMPLANT
KIT TURNOVER CYSTO (KITS) ×2 IMPLANT
LABEL OR SOLS (LABEL) ×2 IMPLANT
LIGASURE VESSEL 5MM BLUNT TIP (ELECTROSURGICAL) ×1 IMPLANT
MANIPULATOR VCARE LG CRV RETR (MISCELLANEOUS) ×1 IMPLANT
MANIPULATOR VCARE SML CRV RETR (MISCELLANEOUS) IMPLANT
MANIPULATOR VCARE STD CRV RETR (MISCELLANEOUS) IMPLANT
NEEDLE HYPO 22GX1.5 SAFETY (NEEDLE) ×1 IMPLANT
NS IRRIG 500ML POUR BTL (IV SOLUTION) ×2 IMPLANT
OCCLUDER COLPOPNEUMO (BALLOONS) ×1 IMPLANT
PACK GYN LAPAROSCOPIC (MISCELLANEOUS) ×2 IMPLANT
PAD ABD DERMACEA PRESS 5X9 (GAUZE/BANDAGES/DRESSINGS) ×1 IMPLANT
PAD OB MATERNITY 4.3X12.25 (PERSONAL CARE ITEMS) ×2 IMPLANT
PAD PREP 24X41 OB/GYN DISP (PERSONAL CARE ITEMS) ×2 IMPLANT
PENCIL ELECTRO HAND CTR (MISCELLANEOUS) ×1 IMPLANT
RETRACTOR WND ALEXIS-O 25 LRG (MISCELLANEOUS) IMPLANT
RTRCTR WOUND ALEXIS O 25CM LRG (MISCELLANEOUS) ×2
SCISSORS METZENBAUM CVD 33 (INSTRUMENTS) ×1 IMPLANT
SET CYSTO W/LG BORE CLAMP LF (SET/KITS/TRAYS/PACK) IMPLANT
SLEEVE ENDOPATH XCEL 5M (ENDOMECHANICALS) ×2 IMPLANT
SPONGE GAUZE 2X2 8PLY STRL LF (GAUZE/BANDAGES/DRESSINGS) ×4 IMPLANT
SPONGE LAP 18X18 RF (DISPOSABLE) ×2 IMPLANT
SUT ENDO VLOC 180-0-8IN (SUTURE) IMPLANT
SUT MNCRL 4-0 (SUTURE) ×1
SUT MNCRL 4-0 27XMFL (SUTURE) ×1
SUT VIC AB 0 CT1 27 (SUTURE) ×3
SUT VIC AB 0 CT1 27XCR 8 STRN (SUTURE) IMPLANT
SUT VIC AB 0 CT1 36 (SUTURE) ×4 IMPLANT
SUT VICRYL 0 UR6 27IN ABS (SUTURE) ×1 IMPLANT
SUTURE MNCRL 4-0 27XMF (SUTURE) ×1 IMPLANT
SYR 10ML LL (SYRINGE) ×2 IMPLANT
SYR 50ML LL SCALE MARK (SYRINGE) ×2 IMPLANT
TROCAR ENDO BLADELESS 11MM (ENDOMECHANICALS) IMPLANT
TROCAR XCEL NON-BLD 5MMX100MML (ENDOMECHANICALS) ×2 IMPLANT
TROCAR XCEL UNIV SLVE 11M 100M (ENDOMECHANICALS) ×2 IMPLANT
TUBING EVAC SMOKE HEATED PNEUM (TUBING) ×2 IMPLANT

## 2018-10-25 NOTE — Anesthesia Preprocedure Evaluation (Signed)
Anesthesia Evaluation  Patient identified by MRN, date of birth, ID band Patient awake    Reviewed: Allergy & Precautions, NPO status , Patient's Chart, lab work & pertinent test results  Airway Mallampati: II  TM Distance: >3 FB     Dental   Pulmonary Current Smoker,    Pulmonary exam normal        Cardiovascular hypertension, Pt. on medications Normal cardiovascular exam     Neuro/Psych negative neurological ROS  negative psych ROS   GI/Hepatic Neg liver ROS, GERD  ,  Endo/Other  negative endocrine ROS  Renal/GU negative Renal ROS  Female GU complaint     Musculoskeletal   Abdominal Normal abdominal exam  (+)   Peds negative pediatric ROS (+)  Hematology  (+) anemia ,   Anesthesia Other Findings   Reproductive/Obstetrics                             Anesthesia Physical Anesthesia Plan  ASA: II  Anesthesia Plan: General   Post-op Pain Management:    Induction: Intravenous  PONV Risk Score and Plan:   Airway Management Planned: Oral ETT  Additional Equipment:   Intra-op Plan:   Post-operative Plan: Extubation in OR  Informed Consent: I have reviewed the patients History and Physical, chart, labs and discussed the procedure including the risks, benefits and alternatives for the proposed anesthesia with the patient or authorized representative who has indicated his/her understanding and acceptance.     Dental advisory given  Plan Discussed with: CRNA and Surgeon  Anesthesia Plan Comments:         Anesthesia Quick Evaluation

## 2018-10-25 NOTE — Op Note (Signed)
Katherine Berger PROCEDURE DATE: 10/25/2018  PREOPERATIVE DIAGNOSIS:  Symptomatic fibroids, menorrhagia She also has some bleeding from rectum and asked for an exam.  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 ASSISTANT: Laverta Baltimore , M.D. ANESTHESIOLOGIST: No responsible provider has been recorded for the case. Anesthesiologist: Alvin Critchley, MD CRNA: Kelton Pillar, CRNA; Dionne Bucy, CRNA; Timoteo Expose, CRNA  OPERATION:  Exam under anesthesia, laparoscopic lysis of adhesions, attempted laparoscopic hysterectomy, conversion toTotal abdominal hysterectomy, Bilateral Salpingectomy  ANESTHESIA:  General endotracheal.  INDICATIONS: The patient is a 48 y.o. F with a large fibroid uterus, with bleeding unresponsive to conservative management who desires definitive surgical management. On the preoperative visit, the risks, benefits, indications, and alternatives of the procedure were reviewed with the patient.  On the day of surgery, the risks of surgery were again discussed with the patient including but not limited to: bleeding which may require transfusion or reoperation; infection which may require antibiotics; injury to bowel, bladder, ureters or other surrounding organs; need for additional procedures; thromboembolic phenomenon, incisional problems and other postoperative/anesthesia complications. Written informed consent was obtained.    OPERATIVE FINDINGS: A 20 week size uterus, boggy and filling the pelvis, with normal tubes and ovaries bilaterally. Large simple cyst on left ovary that was drained. Significant bowel adhesions to the uterus in the posterior cul de sac and left pelvic sidewall, with central omental adhesions at the umbilical port.  Normal appendix and upper abdomen  ESTIMATED BLOOD LOSS: 250 ml FLUIDS:  1200 ml of Lactated Ringers URINE OUTPUT:   200 ml of clear yellow urine. SPECIMENS:  Uterus,cervix,  bilateral fallopian tubes sent to pathology. Uterus + cervix weighed 767 grams COMPLICATIONS:  None immediate.   PROCEDURE IN DETAIL:  The patient received prophalactic intravenous antibiotics and had sequential compression devices applied to her lower extremities while in the preoperative area.  She was then taken to the operating room where general anesthesia was administered and was found to be adequate.  She was placed in the dorsal lithotomy position, and was prepped and draped in a sterile manner.  A formal time out was performed with all team members present and in agreement. An exam under anesthesia revealed a 20-week size uterus to her umbilicus.  Scar tissue from a prior obstetric tear was noted in her posterior introitus, but her rectal sphincter was intact.  No evidence of vaginal fistula was noted on exam.  A V-care uterine manipulator was placed at this time.  A Foley catheter was inserted into her bladder and attached to constant drainage. Attention was turned to the abdomen and 0.5% Marcaine infused subq. A 48mm umbilical incision was made with the scalpel.  The Optiview 10-mm trocar and sleeve were then advanced without difficulty with the laparoscope under direct visualization into the abdomen.  The abdomen was then insufflated with carbon dioxide gas and adequate pneumoperitoneum was obtained.  A survey of the patient's pelvis and abdomen revealed the findings above.  Bilateral lower quadrant ports (5 mm on the left and 11 mm on the right) were then placed under direct visualization.  The pelvis was then carefully examined.    Significant adhesions between the omentum and the anterior abdominal wall were taken down with the LigaSure device.  Notably, her bowel was adherent to the uterine fundus, the posterior cul-de-sac and uterus, and the left pelvic sidewall.  These were carefully dissected and taken down as well.  Attention was  turned to the left ovary, and the functional cyst was incised and drained.  This allowed for visualization of the left adnexa, and the ovary and tube were dissected off of the uterus.  The utero-ovarian ligament was dissected using LigaSure device, and the anterior and posterior leaflets of the broad were taken down to the internal cervical loss.  At this point, the left uterine artery was fulgurated at its insertion point.  However, there were significant collateral vessels and her uterus was difficult enough to manipulate despite adequate functioning tools, and the decision was made to transition to an open hysterectomy for safety of her nearby bowels and vessels. The uterine manipulator was removed.  Attention was turned to the abdomen, and a Pfannensteil skin incision was made. This incision was taken down to the fascia using electrocautery with care given to maintain good hemostasis. The fascia was incised in the midline and the fascial incision was then extended bilaterally using electrocautery without difficulty. The fascia was then dissected off the underlying rectus muscles using blunt and sharp dissection. The rectus muscles were split bluntly in the midline and the peritoneum entered sharply without complication. This peritoneal incision was then extended superiorly and inferiorly with care given to prevent bowel or bladder injury. Attention was then turned to the pelvis. A retractor was placed into the incision, and the bowel was packed away with moist laparotomy sponges. The uterus at this point was noted to be mobilized and was delivered up out of the abdomen.  The round ligaments on the right side were clamped, suture ligated with 0 Vicryl, and transected with electrocautery allowing entry into the broad ligament. Of note, all sutures used in this procedure are 0 Vicryl unless otherwise noted. The anterior and posterior leaves of the broad ligament were separated, and the ureters were inspected to  be safely away from the area of dissection bilaterally.  Adnexae were clamped on the patient's right side, cut, and doubly suture ligated. Kelly clamps were placed on the mesosalpinx of the right fallopian tube, and the fallopian tube was excised.  The pedicle was then secured with a free tie.     A bladder flap was then created.  The bladder was then bluntly dissected off the lower uterine segment and cervix with good hemostasis noted. The uterine arteries were then skeletonized bilaterally and then clamped, cut, and doubly suture ligated with care given to prevent ureteral injury.   The uterus was amputated to allow for excellent visualization.  The uterosacral ligaments were then clamped, cut, and ligated bilaterally.  Finally, the cardinal ligaments were clamped, cut, and ligated bilaterally.  Acutely curved clamps were placed across the vagina just under the cervix, and the remaining cervix was amputated and sent to pathology. The vaginal cuff angles were closed with Heaney stiches with care given to incorporate the uterosacral-cardinal ligament pedicles on both sides. The middle of the vaginal cuff was closed with a series of interrupted figure-of-eight sutures with care given to incorporate the anterior pubocervical fascia and the posterior rectovaginal fascia.   The pelvis was irrigated and hemostasis was reconfirmed at all pedicles and along the pelvic sidewall.  The ureters were inspected and noted to be peristalsing bilaterally.  All laparotomy sponges and instruments were removed from the abdomen. The peritoneum was closed with a running stitch, and the fascia was also closed in a running fashion. The subcutaneous layer was reapproximated with 2-0 Vicryl. The skin was closed with a 4-0 Vicryl subcuticular stitch.  Exparel was  infused with marcaine in standard fashion along fascial and skin lines.   Sponge, lap, needle, and instrument counts were correct times two. The patient was taken to the  recovery area awake, extubated and in stable condition.   She is interested in referral to rectal surgeon or urogyn for evaluation of rectal bleeding with each prior menses.

## 2018-10-25 NOTE — Progress Notes (Signed)
PHARMACIST - PHYSICIAN ORDER COMMUNICATION  CONCERNING: P&T Medication Policy on Herbal Medications  DESCRIPTION:  This patient's order for:  Potassium 99 mg tablets  has been noted.  This product(s) is classified as an "herbal" or natural product. Due to a lack of definitive safety studies or FDA approval, nonstandard manufacturing practices, plus the potential risk of unknown drug-drug interactions while on inpatient medications, the Pharmacy and Therapeutics Committee does not permit the use of "herbal" or natural products of this type within [facility].   ACTION TAKEN: The pharmacy department is unable to verify this order at this time and your patient has been informed of this safety policy. Please reevaluate patient's clinical condition at discharge and address if the herbal or natural product(s) should be resumed at that time.

## 2018-10-25 NOTE — Anesthesia Procedure Notes (Signed)
Procedure Name: Intubation Date/Time: 10/25/2018 12:55 PM Performed by: Dionne Bucy, CRNA Pre-anesthesia Checklist: Patient identified, Patient being monitored, Timeout performed, Emergency Drugs available and Suction available Patient Re-evaluated:Patient Re-evaluated prior to induction Oxygen Delivery Method: Circle system utilized Preoxygenation: Pre-oxygenation with 100% oxygen Induction Type: IV induction Ventilation: Mask ventilation without difficulty Laryngoscope Size: 3 and McGraph Grade View: Grade I Tube type: Oral Tube size: 7.0 mm Number of attempts: 1 Airway Equipment and Method: Stylet and Video-laryngoscopy Placement Confirmation: ETT inserted through vocal cords under direct vision,  positive ETCO2 and breath sounds checked- equal and bilateral Secured at: 21 cm Tube secured with: Tape Dental Injury: Teeth and Oropharynx as per pre-operative assessment

## 2018-10-25 NOTE — Anesthesia Post-op Follow-up Note (Signed)
Anesthesia QCDR form completed.        

## 2018-10-25 NOTE — Interval H&P Note (Signed)
History and Physical Interval Note:  10/25/2018 10:51 AM  Katherine Berger  has presented today for surgery, with the diagnosis of Abnormal Uterine Bleeding and Fibroid.  The various methods of treatment have been discussed with the patient and family. After consideration of risks, benefits and other options for treatment, the patient has consented to  Procedure(s): HYSTERECTOMY TOTAL LAPAROSCOPIC (N/A) bilateral salpingectomy as a surgical intervention.  The patient's history has been reviewed, patient examined, no change in status, stable for surgery.  I have reviewed the patient's chart and labs.  Questions were answered to the patient's satisfaction.     Benjaman Kindler

## 2018-10-25 NOTE — Transfer of Care (Signed)
Immediate Anesthesia Transfer of Care Note  Patient: Heloise Purpura  Procedure(s) Performed: HYSTERECTOMY TOTAL LAPAROSCOPIC CONVERTED TO OPEN (N/A Abdomen)  Patient Location: PACU  Anesthesia Type:General  Level of Consciousness: awake, alert , oriented and patient cooperative  Airway & Oxygen Therapy: Patient Spontanous Breathing and Patient connected to face mask oxygen  Post-op Assessment: Report given to RN and Post -op Vital signs reviewed and stable  Post vital signs: Reviewed and stable  Last Vitals:  Vitals Value Taken Time  BP 120/106 10/25/18 1624  Temp 36.2 C 10/25/18 1624  Pulse 81 10/25/18 1628  Resp 17 10/25/18 1627  SpO2 100 % 10/25/18 1628  Vitals shown include unvalidated device data.  Last Pain:  Vitals:   10/25/18 1624  PainSc: (P) Asleep         Complications: No apparent anesthesia complications

## 2018-10-26 LAB — BASIC METABOLIC PANEL
Anion gap: 8 (ref 5–15)
BUN: 11 mg/dL (ref 6–20)
CO2: 22 mmol/L (ref 22–32)
Calcium: 7.8 mg/dL — ABNORMAL LOW (ref 8.9–10.3)
Chloride: 106 mmol/L (ref 98–111)
Creatinine, Ser: 0.7 mg/dL (ref 0.44–1.00)
GFR calc Af Amer: >60 mL/min (ref >60)
GFR calc non Af Amer: >60 mL/min (ref >60)
Glucose, Bld: 136 mg/dL — ABNORMAL HIGH (ref 70–99)
Potassium: 3.9 mmol/L (ref 3.5–5.1)
Sodium: 136 mmol/L (ref 135–145)

## 2018-10-26 LAB — CBC
HCT: 27.3 % — ABNORMAL LOW (ref 36.0–46.0)
Hemoglobin: 8.1 g/dL — ABNORMAL LOW (ref 12.0–15.0)
MCH: 24.3 pg — ABNORMAL LOW (ref 26.0–34.0)
MCHC: 29.7 g/dL — ABNORMAL LOW (ref 30.0–36.0)
MCV: 82 fL (ref 80.0–100.0)
Platelets: 302 10*3/uL (ref 150–400)
RBC: 3.33 MIL/uL — ABNORMAL LOW (ref 3.87–5.11)
RDW: 24.3 % — ABNORMAL HIGH (ref 11.5–15.5)
WBC: 15.7 10*3/uL — ABNORMAL HIGH (ref 4.0–10.5)
nRBC: 0 % (ref 0.0–0.2)

## 2018-10-26 MED ORDER — ONDANSETRON 4 MG PO TBDP: 4.0000 mg | ORAL_TABLET | Freq: Four times a day (QID) | ORAL | Status: DC | PRN

## 2018-10-26 MED ORDER — AMMONIA AROMATIC IN INHA
RESPIRATORY_TRACT | Status: AC
  Filled 2018-10-26: qty 10

## 2018-10-26 MED ORDER — ONDANSETRON HCL 4 MG/2ML IJ SOLN
4.0000 mg | Freq: Four times a day (QID) | INTRAMUSCULAR | Status: DC | PRN
  Administered 2018-10-26 – 2018-10-27 (×2): 4 mg via INTRAVENOUS
  Filled 2018-10-26 (×3): qty 2

## 2018-10-26 MED ORDER — LACTATED RINGERS IV BOLUS
500.0000 mL | Freq: Once | INTRAVENOUS | Status: AC
  Administered 2018-10-26: 20:00:00 500 mL via INTRAVENOUS

## 2018-10-26 MED ORDER — OXYCODONE HCL 5 MG PO TABS
5.0000 mg | ORAL_TABLET | ORAL | Status: DC | PRN
  Administered 2018-10-26 – 2018-10-28 (×11): 5 mg via ORAL
  Filled 2018-10-26 (×11): qty 1

## 2018-10-26 MED ORDER — SENNOSIDES-DOCUSATE SODIUM 8.6-50 MG PO TABS
2.0000 | ORAL_TABLET | Freq: Every day | ORAL | Status: DC
  Administered 2018-10-26 – 2018-10-28 (×3): 2 via ORAL
  Filled 2018-10-26 (×3): qty 2

## 2018-10-26 MED ORDER — ACETAMINOPHEN 500 MG PO TABS
1000.0000 mg | ORAL_TABLET | Freq: Four times a day (QID) | ORAL | Status: AC
  Administered 2018-10-26 – 2018-10-29 (×12): 1000 mg via ORAL
  Filled 2018-10-26 (×13): qty 2

## 2018-10-26 MED ORDER — CALCIUM CARBONATE ANTACID 500 MG PO CHEW
1.0000 | CHEWABLE_TABLET | ORAL | Status: DC | PRN
  Administered 2018-10-26: 11:00:00 200 mg via ORAL
  Filled 2018-10-26: qty 1

## 2018-10-26 MED ORDER — PANTOPRAZOLE SODIUM 40 MG PO TBEC
40.0000 mg | DELAYED_RELEASE_TABLET | Freq: Every day | ORAL | Status: DC
  Administered 2018-10-26 – 2018-10-29 (×4): 40 mg via ORAL
  Filled 2018-10-26 (×4): qty 1

## 2018-10-26 MED ORDER — LACTATED RINGERS IV BOLUS: 500.0000 mL | Freq: Once | INTRAVENOUS | Status: DC

## 2018-10-26 NOTE — Progress Notes (Addendum)
Foley catheter removed by CNM at 1030. PCA discontinued at 1200. Patient attempted to ambulate to the bathroom for the first time with NT at 1245. NT called RN to room. Patient very lightheaded and "feels like passing out." Patient sat on the side of the bed for 10 minutes with NT and two RNs. Patient's dizziiness/lightheadedness subsided. Patient to bedside commode for first attempt to void post foley. Void attempt unsuccessful. Patient back to bed. RN to continue to monitor.

## 2018-10-26 NOTE — Progress Notes (Signed)
RN in room to get VS, check foley and check PCA; pt awake when RN enters room; pt holds up right hand and says "look, my hand looks puffy"; RN looked at right hand and yes, hand is edematous and lower forearm is edematous; pt reports "I did hit my hand on that tray earlier"; RN said "we'll just move the iv and pca lines to your other iv site on your left outer forearm"; RN turns iv pump off at this time; RN goes to left side of bed to access the SL in pt's left outer forearm; the SL is not there BUT the tegaderm dressing (that was covering the SL site) is present and appears intact; there is a tiny spot of blood under the tegaderm where the SL site was; pt has been moving from her back, to her left side and then back to her back independently;  RN checked bed, sheets, blanket and floor and SL not found; RN reassured pt that there is NO needle; tegaderm removed from left outer forearm and IV removed from right hand; RN placed order for IV team to come re-start iv access

## 2018-10-26 NOTE — Plan of Care (Signed)
Vs stable and BP improved after pt took BP med; pt moving independently from back, to left side and then back to her back; iv site in left inner forearm is WNL (RN checked site at 0530 when giving iv toradol); urine is right at 14mL per hour and a little amber in color; pt encouraged to drink water (when she's more awake); pt's right hand has improved where she has had it propped and ice on it; pt would like to "take the ice off my hand for a little bit"; RN removed ice; pt requested (at 0530) for SCDs to be off for "a little bit"; pt also asked about removing the pulse oximeter on her toe and the CO2 monitor on her nose/mouth "for a little bit"; RN reminded pt that those two items are linked to her PCA ("your pain medicine button") and pt can't use PCA without those two items in place as they are for safety; diet advanced to regular for breakfast

## 2018-10-26 NOTE — Progress Notes (Signed)
Iv team personnel just got iv access again; iv fluids and pca now infusing in the iv site in pt's left inner forearm; fluids and pca restarted at 0345; pt's right hand propped up on a pillow and ice pack placed on pt's right hand

## 2018-10-26 NOTE — Progress Notes (Signed)
Patient up to bathroom to void with NT at 1710. 79ml amber urine. Patient's 2nd attempt but first void post foley catheter removal. Bladder scan done and reading 44ml in bladder. RN encouraged patient to drink more water. CNM, R. McVey, updated and patient to receive fluid bolus.

## 2018-10-26 NOTE — Progress Notes (Signed)
Patient very tearful and talking with RN about problems with her husband, his alcohol problem and her home life. Tons of emotional support provided. RN discussed patient's safety at home and patient states she feels safe at home and around her husband. Patient also states "Im ok" and that she doesn't need to speak further with anyone. RN to continue to monitor.

## 2018-10-26 NOTE — Progress Notes (Signed)
Per night shift RN, patient has tolerated a clear liquid diet throughout the night. Patient states she is ready for a regular diet. Patient encouraged to start with bland foods. Patient has had 2 episodes of emesis at 0830 and 0915. Patient eating breakfast currently. RN and NT encouraged patient to stop eating breakfast, as this is patient's first meal on a regular diet. Patient states "I'm hungry so I'm going to finish breakfast." Patient also refuses zofran at this time, stating she feels better and doesn't need it. RN encouraged patient to take zofran although she doesn't feel like she needs it because she is still eating. Patient again refuses. RN will continue to monitor.

## 2018-10-26 NOTE — Progress Notes (Signed)
RN encouraged patient to void. Patient wanted to rest and "take a nap" at this time. RN to get patient out of bed to void after patient nap.

## 2018-10-26 NOTE — Progress Notes (Signed)
1 Day Post-Op       Procedure(s): HYSTERECTOMY TOTAL LAPAROSCOPIC CONVERTED TO OPEN (N/A) Subjective: The patient is doing well.  No nausea or vomiting. Pain is adequately controlled on PCA.  Objective: Vital signs in last 24 hours: Temp:  [97.2 F (36.2 C)-98.7 F (37.1 C)] 98 F (36.7 C) (07/25 0312) Pulse Rate:  [81-100] 88 (07/25 0312) Resp:  [11-27] 17 (07/25 0755) BP: (100-143)/(67-106) 100/67 (07/25 0312) SpO2:  [99 %-100 %] 100 % (07/25 0755) Weight:  [81 kg] 81 kg (07/24 1018)  Intake/Output  Intake/Output Summary (Last 24 hours) at 10/26/2018 0818 Last data filed at 10/26/2018 0804 Gross per 24 hour  Intake 2936.85 ml  Output 935 ml  Net 2001.85 ml    Physical Exam:  General: Alert and oriented. CV: RRR Lungs: Clear bilaterally. GI: Soft, Nondistended. Dressings/incisions:  pfannienstiel incision CDI with medical glue, no erythema. 2 laparoscopy incisions without erythema, medical glue intact.  Urine: Clear, Foley in place Extremities: Nontender, no erythema, no edema.  Lab Results: Recent Labs    10/23/18 0944 10/26/18 0003  HGB 8.9* 8.1*  HCT 29.7* 27.3*  WBC 7.6 15.7*  PLT 328 302                 Results for orders placed or performed during the hospital encounter of 10/25/18 (from the past 24 hour(s))  Pregnancy, urine POC     Status: None   Collection Time: 10/25/18 10:05 AM  Result Value Ref Range   Preg Test, Ur NEGATIVE NEGATIVE  CBC     Status: Abnormal   Collection Time: 10/26/18 12:03 AM  Result Value Ref Range   WBC 15.7 (H) 4.0 - 10.5 K/uL   RBC 3.33 (L) 3.87 - 5.11 MIL/uL   Hemoglobin 8.1 (L) 12.0 - 15.0 g/dL   HCT 27.3 (L) 36.0 - 46.0 %   MCV 82.0 80.0 - 100.0 fL   MCH 24.3 (L) 26.0 - 34.0 pg   MCHC 29.7 (L) 30.0 - 36.0 g/dL   RDW 24.3 (H) 11.5 - 15.5 %   Platelets 302 150 - 400 K/uL   nRBC 0.0 0.0 - 0.2 %  Basic metabolic panel     Status: Abnormal   Collection Time: 10/26/18 12:03 AM  Result Value Ref Range   Sodium 136  135 - 145 mmol/L   Potassium 3.9 3.5 - 5.1 mmol/L   Chloride 106 98 - 111 mmol/L   CO2 22 22 - 32 mmol/L   Glucose, Bld 136 (H) 70 - 99 mg/dL   BUN 11 6 - 20 mg/dL   Creatinine, Ser 0.70 0.44 - 1.00 mg/dL   Calcium 7.8 (L) 8.9 - 10.3 mg/dL   GFR calc non Af Amer >60 >60 mL/min   GFR calc Af Amer >60 >60 mL/min   Anion gap 8 5 - 15    Assessment/Plan: 1 Day Post-Op       Procedure(s): HYSTERECTOMY TOTAL LAPAROSCOPIC CONVERTED TO OPEN (N/A)  1) Ambulate, Incentive spirometry with Cough/deep breathing exercises q1hr. Continue SCDs until ambulatory 2) Advance diet as tolerated, tums and Protonix for reflux/heartburn.  3) Transition to oral pain medication with iv rescue as tolerated 4) D/C Foley 5) -Acute blood loss anemia on chronic anemia - hemodynamically stable and asymptomatic; start PO ferrous sulfate BID with stool softeners and ascorbic acid.     Benjaman Kindler, MD   LOS: 1 day   Benjaman Kindler 10/26/2018, 8:18 AM

## 2018-10-27 ENCOUNTER — Encounter: Payer: Self-pay | Admitting: Obstetrics and Gynecology

## 2018-10-27 LAB — CBC
HCT: 21.8 % — ABNORMAL LOW (ref 36.0–46.0)
Hemoglobin: 6.5 g/dL — ABNORMAL LOW (ref 12.0–15.0)
MCH: 24.1 pg — ABNORMAL LOW (ref 26.0–34.0)
MCHC: 29.8 g/dL — ABNORMAL LOW (ref 30.0–36.0)
MCV: 80.7 fL (ref 80.0–100.0)
Platelets: 247 10*3/uL (ref 150–400)
RBC: 2.7 MIL/uL — ABNORMAL LOW (ref 3.87–5.11)
RDW: 24.4 % — ABNORMAL HIGH (ref 11.5–15.5)
WBC: 7.4 10*3/uL (ref 4.0–10.5)
nRBC: 0 % (ref 0.0–0.2)

## 2018-10-27 LAB — BASIC METABOLIC PANEL
Anion gap: 6 (ref 5–15)
BUN: 9 mg/dL (ref 6–20)
CO2: 24 mmol/L (ref 22–32)
Calcium: 7.8 mg/dL — ABNORMAL LOW (ref 8.9–10.3)
Chloride: 107 mmol/L (ref 98–111)
Creatinine, Ser: 0.77 mg/dL (ref 0.44–1.00)
GFR calc Af Amer: >60 mL/min (ref >60)
GFR calc non Af Amer: >60 mL/min (ref >60)
Glucose, Bld: 95 mg/dL (ref 70–99)
Potassium: 3.7 mmol/L (ref 3.5–5.1)
Sodium: 137 mmol/L (ref 135–145)

## 2018-10-27 LAB — HEMOGLOBIN AND HEMATOCRIT, BLOOD
HCT: 29.6 % — ABNORMAL LOW (ref 36.0–46.0)
Hemoglobin: 9.5 g/dL — ABNORMAL LOW (ref 12.0–15.0)

## 2018-10-27 MED ORDER — POLYETHYLENE GLYCOL 3350 17 G PO PACK
17.0000 g | PACK | Freq: Every day | ORAL | Status: DC
  Administered 2018-10-27 – 2018-10-29 (×3): 17 g via ORAL
  Filled 2018-10-27 (×3): qty 1

## 2018-10-27 MED ORDER — DIPHENHYDRAMINE HCL 25 MG PO CAPS
25.0000 mg | ORAL_CAPSULE | Freq: Once | ORAL | Status: AC
  Administered 2018-10-27: 12:00:00 25 mg via ORAL
  Filled 2018-10-27: qty 1

## 2018-10-27 MED ORDER — METOCLOPRAMIDE HCL 10 MG PO TABS
10.0000 mg | ORAL_TABLET | Freq: Three times a day (TID) | ORAL | Status: DC | PRN
  Administered 2018-10-27 – 2018-10-28 (×2): 10 mg via ORAL
  Filled 2018-10-27 (×2): qty 1

## 2018-10-27 MED ORDER — SODIUM CHLORIDE 0.9% IV SOLUTION
Freq: Once | INTRAVENOUS | Status: AC
  Administered 2018-10-27: 11:00:00 via INTRAVENOUS

## 2018-10-27 MED ORDER — ACETAMINOPHEN 325 MG PO TABS
650.0000 mg | ORAL_TABLET | Freq: Once | ORAL | Status: DC
  Filled 2018-10-27: qty 2

## 2018-10-27 NOTE — Progress Notes (Signed)
2 Days Post-Op       Procedure(s): HYSTERECTOMY TOTAL LAPAROSCOPIC CONVERTED TO OPEN (N/A) Subjective: The patient is doing well.  No nausea or vomiting. Pain is adequately controlled - iv rescue dilaudid once overnight. No BM yet, and dizzy on walking. UOP was low yesterday and improved with a 1L x3 hrs bolus. Pain worse this morning, is on scheduled ERAS with oxy po and dilaudid iv prn  Objective: Vital signs in last 24 hours: Temp:  [98 F (36.7 C)-99.3 F (37.4 C)] 99.3 F (37.4 C) (07/26 0806) Pulse Rate:  [67-90] 90 (07/26 0806) Resp:  [14-20] 20 (07/26 0806) BP: (84-125)/(54-83) 113/68 (07/26 0806) SpO2:  [97 %-100 %] 99 % (07/26 0806)  Intake/Output  Intake/Output Summary (Last 24 hours) at 10/27/2018 1006 Last data filed at 10/27/2018 0620 Gross per 24 hour  Intake 2983.18 ml  Output 1650 ml  Net 1333.18 ml    Physical Exam:  General: Alert and oriented. CV: RRR Lungs: Clear bilaterally. GI: Soft, Nondistended. Minimal bowel sounds Incisions: Clean and dry. Extremities: Nontender, no erythema, no edema.  Lab Results: Recent Labs    10/26/18 0003 10/27/18 0610  HGB 8.1* 6.5*  HCT 27.3* 21.8*  WBC 15.7* 7.4  PLT 302 247                 Results for orders placed or performed during the hospital encounter of 10/25/18 (from the past 24 hour(s))  Basic metabolic panel     Status: Abnormal   Collection Time: 10/27/18  6:10 AM  Result Value Ref Range   Sodium 137 135 - 145 mmol/L   Potassium 3.7 3.5 - 5.1 mmol/L   Chloride 107 98 - 111 mmol/L   CO2 24 22 - 32 mmol/L   Glucose, Bld 95 70 - 99 mg/dL   BUN 9 6 - 20 mg/dL   Creatinine, Ser 0.77 0.44 - 1.00 mg/dL   Calcium 7.8 (L) 8.9 - 10.3 mg/dL   GFR calc non Af Amer >60 >60 mL/min   GFR calc Af Amer >60 >60 mL/min   Anion gap 6 5 - 15  CBC     Status: Abnormal   Collection Time: 10/27/18  6:10 AM  Result Value Ref Range   WBC 7.4 4.0 - 10.5 K/uL   RBC 2.70 (L) 3.87 - 5.11 MIL/uL   Hemoglobin 6.5 (L)  12.0 - 15.0 g/dL   HCT 21.8 (L) 36.0 - 46.0 %   MCV 80.7 80.0 - 100.0 fL   MCH 24.1 (L) 26.0 - 34.0 pg   MCHC 29.8 (L) 30.0 - 36.0 g/dL   RDW 24.4 (H) 11.5 - 15.5 %   Platelets 247 150 - 400 K/uL   nRBC 0.0 0.0 - 0.2 %    Assessment/Plan: 2 Days Post-Op       Procedure(s): HYSTERECTOMY TOTAL LAPAROSCOPIC CONVERTED TO OPEN (N/A)  1) Ambulate, Incentive spirometry 2) s/p FOley- low urine output resolved overnight 3) Pain: continue scheduled ERAS with po oxy and iv dilaudid PRN 4) -Acute blood loss anemia - orthostatic vitals now significant with sx. Recommend 2u pRBCs.  Continue PO ferrous sulfate BID with stool softeners and ascorbic acid   Benjaman Kindler, MD   LOS: 2 days   Benjaman Kindler 10/27/2018, 10:06 AM

## 2018-10-27 NOTE — Plan of Care (Signed)
Vs stable; up with assistance; able to SL the iv; taking PO tylenol, oxycodone, and motrin; tolerating regular diet with no emesis this shift; pt tolerating liquids very well; pt did get some rest last night

## 2018-10-27 NOTE — Progress Notes (Signed)
Vitals stable. Patient tolerating PO foods/fluids. Pain staying around 8-9/10 in lower abdomen. Patient has had one dose of IV dilaudid this shift but taking mostly PO ibuprofen, tylenol and gabapentin. No N/V this shift so far. Patient now passing some flatus but requesting medicine to help her stool. Provider, Dr. Leafy Ro, in department and updated. Miralax and reglan ordered and given. Patient has been up to the bathroom several times with assistance. Patient states she gets dizzy when she looks down. Last two voids unmeasured as patient took container out of toilet to attempt a bowel movement. Patient spoke with provider about blood transfusion and transfusion reactions explained to patient. Blood consent signed and in the chart. Patient to receive 2 units of PRBCs before showering. RN to continue to monitor.

## 2018-10-28 MED ORDER — CHEWING GUM (ORBIT) SUGAR FREE
1.0000 | CHEWING_GUM | Freq: Four times a day (QID) | ORAL | Status: DC
  Administered 2018-10-28 – 2018-10-29 (×4): 1 via ORAL
  Filled 2018-10-28: qty 1

## 2018-10-28 MED ORDER — OXYCODONE HCL 5 MG PO TABS
5.0000 mg | ORAL_TABLET | ORAL | Status: DC | PRN
  Administered 2018-10-28 – 2018-10-29 (×4): 10 mg via ORAL
  Filled 2018-10-28 (×4): qty 2

## 2018-10-28 MED ORDER — BISACODYL 10 MG RE SUPP
10.0000 mg | Freq: Every day | RECTAL | Status: DC | PRN
  Administered 2018-10-28: 13:00:00 10 mg via RECTAL
  Filled 2018-10-28: qty 1

## 2018-10-28 NOTE — Anesthesia Postprocedure Evaluation (Signed)
Anesthesia Post Note  Patient: Katherine Berger  Procedure(s) Performed: HYSTERECTOMY TOTAL LAPAROSCOPIC CONVERTED TO OPEN (N/A Abdomen)  Patient location during evaluation: PACU Anesthesia Type: General Level of consciousness: awake and alert and oriented Pain management: pain level controlled Vital Signs Assessment: post-procedure vital signs reviewed and stable Respiratory status: spontaneous breathing Cardiovascular status: blood pressure returned to baseline Anesthetic complications: no     Last Vitals:  Vitals:   10/27/18 2318 10/28/18 0247  BP: 130/89 138/87  Pulse: 91 87  Resp: 18 20  Temp: 36.9 C 37.1 C  SpO2: 100% 93%    Last Pain:  Vitals:   10/28/18 0355  TempSrc:   PainSc: Asleep                 Astraea Gaughran

## 2018-10-28 NOTE — Progress Notes (Signed)
3 Days Post-Op       Procedure(s): HYSTERECTOMY TOTAL LAPAROSCOPIC CONVERTED TO OPEN (N/A) Subjective: The patient is doing well.  No nausea or vomiting. Pain is adequately controlled with only PM meds. No BM yet and upper abdomen is firmly distended but no peritoneal signs. +flatus, +good appetite without n/v.  Objective: Vital signs in last 24 hours: Temp:  [97.6 F (36.4 C)-99 F (37.2 C)] 99 F (37.2 C) (07/27 1607) Pulse Rate:  [66-91] 81 (07/27 1607) Resp:  [17-20] 18 (07/27 1607) BP: (130-152)/(87-99) 152/99 (07/27 1607) SpO2:  [93 %-100 %] 100 % (07/27 1607)  Intake/Output  Intake/Output Summary (Last 24 hours) at 10/28/2018 1827 Last data filed at 10/27/2018 1837 Gross per 24 hour  Intake 388.83 ml  Output -  Net 388.83 ml    Physical Exam:  General: Alert and oriented. CV: RRR Lungs: Clear bilaterally. GI: lower is soft, nondistended. Minimal bowel sounds, upper firm and distended. Incisions: Clean and dry. Extremities: Nontender, no erythema, no edema.  Lab Results: Recent Labs    10/26/18 0003 10/27/18 0610 10/27/18 2243  HGB 8.1* 6.5* 9.5*  HCT 27.3* 21.8* 29.6*  WBC 15.7* 7.4  --   PLT 302 247  --                  Results for orders placed or performed during the hospital encounter of 10/25/18 (from the past 24 hour(s))  Hemoglobin and hematocrit, blood     Status: Abnormal   Collection Time: 10/27/18 10:43 PM  Result Value Ref Range   Hemoglobin 9.5 (L) 12.0 - 15.0 g/dL   HCT 29.6 (L) 36.0 - 46.0 %    Assessment/Plan: 3 Days Post-Op       Procedure(s): HYSTERECTOMY TOTAL LAPAROSCOPIC CONVERTED TO OPEN (N/A)  1) Ambulate, Incentive spirometry 2) s/p Foley 3) Pain: continue scheduled ERAS with po oxy 4) -Acute blood loss anemia - s/p 2u pRBCs with resolution of sx and improvement in lab values.  Continue PO ferrous sulfate BID with stool softeners and ascorbic acid 5) possible ileus without n/v, but with flatus - continue conservative  measures.    Benjaman Kindler, MD   LOS: 3 days   Benjaman Kindler 10/28/2018, 6:27 PM

## 2018-10-29 LAB — TYPE AND SCREEN
ABO/RH(D): A POS
Antibody Screen: POSITIVE
Donor AG Type: NEGATIVE
Donor AG Type: NEGATIVE
Unit division: 0
Unit division: 0
Unit division: 0
Unit division: 0

## 2018-10-29 LAB — BPAM RBC
Blood Product Expiration Date: 202008012359
Blood Product Expiration Date: 202008102359
Blood Product Expiration Date: 202008112359
Blood Product Expiration Date: 202008122359
ISSUE DATE / TIME: 202007261155
ISSUE DATE / TIME: 202007261604
Unit Type and Rh: 5100
Unit Type and Rh: 5100
Unit Type and Rh: 5100
Unit Type and Rh: 6200

## 2018-10-29 LAB — SURGICAL PATHOLOGY

## 2018-10-29 MED ORDER — OXYCODONE HCL 5 MG PO CAPS: 5.0000 mg | ORAL_CAPSULE | Freq: Four times a day (QID) | ORAL | 0 refills | Status: DC | PRN

## 2018-10-29 MED ORDER — ACETAMINOPHEN 500 MG PO TABS: 1000.0000 mg | ORAL_TABLET | Freq: Four times a day (QID) | ORAL | 0 refills | Status: AC

## 2018-10-29 MED ORDER — GABAPENTIN 800 MG PO TABS: 800.0000 mg | ORAL_TABLET | Freq: Every day | ORAL | 0 refills | Status: AC

## 2018-10-29 MED ORDER — IBUPROFEN 800 MG PO TABS: 800.0000 mg | ORAL_TABLET | Freq: Three times a day (TID) | ORAL | 1 refills | Status: AC

## 2018-10-29 NOTE — Discharge Summary (Signed)
4 Days Post-Op       Procedure(s): HYSTERECTOMY TOTAL LAPAROSCOPIC CONVERTED TO OPEN (N/A) Subjective: The patient is doing well.  No nausea or vomiting. Pain is adequately controlled with only PM meds. No BM yet and upper abdomen is firmly distended but no peritoneal signs. +flatus, +good appetite without n/v.  Objective: Vital signs in last 24 hours: Temp:  [98 F (36.7 C)-99.3 F (37.4 C)] 98.6 F (37 C) (07/28 1131) Pulse Rate:  [69-90] 69 (07/28 1131) Resp:  [18-20] 18 (07/28 1131) BP: (113-152)/(72-99) 113/85 (07/28 1131) SpO2:  [100 %] 100 % (07/28 0745)  Intake/Output No intake or output data in the 24 hours ending 10/29/18 1233  Physical Exam:  General: Alert and oriented. CV: RRR Lungs: Clear bilaterally. GI: lower is soft, nondistended. Minimal bowel sounds, upper firm and distended. Incisions: Clean and dry. Extremities: Nontender, no erythema, no edema.  Lab Results: Recent Labs    10/27/18 0610 10/27/18 2243  HGB 6.5* 9.5*  HCT 21.8* 29.6*  WBC 7.4  --   PLT 247  --                   Assessment/Plan: 4 Days Post-Op       Procedure(s): HYSTERECTOMY TOTAL LAPAROSCOPIC CONVERTED TO OPEN (N/A)  1) Ambulate, Incentive spirometry 2) s/p Foley 3) Pain: continue scheduled ERAS with po oxy 4) -Acute blood loss anemia - s/p 2u pRBCs with resolution of sx and improvement in lab values.  Continue PO ferrous sulfate BID with stool softeners and ascorbic acid 5) possible ileus without n/v: resolved 6) Discharge home today   Benjaman Kindler, MD   LOS: 4 days   Benjaman Kindler 10/29/2018, 12:33 PM

## 2018-10-29 NOTE — Discharge Instructions (Signed)
Here is a helpful article from the website [employer], regarding constipation  Here are reasons why constipation occurs after surgery: 1) During the operation and in the recovery room, most people are given opioid pain medication, primarily through an IV, to treat moderate or severe pain. Intravenous opioids include morphine, Dilaudid and fentanyl. After surgery, patients are often prescribed opioid pain medication to take by mouth at home, including codeine, Vicodin, Norco, and Percocet. All of these medications cause constipation by slowing down the movement of your intestine. 2) Changes in your diet before surgery can be another culprit. It is common to get specific instructions to change how you normally eat or drink before your surgery, like only having liquids the day before or not having anything to eat or drink after midnight the night before surgery. For this reason, temporary dehydration may occur. This, along with not eating or only having liquids, means that you are getting less fiber than usual. Both these factors contribute to constipation. 3) Changes in your diet after surgery can also contribute to the problem. Although many people dont have dietary restrictions after operations, being under anesthesia can make you lose your appetite for several hours and maybe even days. Some people can even have nausea or vomiting. Not eating or drinking normally means that you are not getting enough fiber and you can get dehydrated, both leading to constipation. 4) Lying in a bed more than usual--which happens before, during and after surgery--combined with the medications and diet changes, all work together to slow down your colon and make your poop turn to rock.  No one likes to be constipated.  Lets face it, its not a pleasant feeling when you dont poop for days, then strain on the toilet to finally pass something large enough to cause damage. An ounce of prevention is worth a pound of cure,  so: 1. Assume you will be constipated. 2. Plan and prepare accordingly. Post-surgery is one of those unique situations where the temporary use of laxatives can make a world of difference. Always consult with your doctor, and recognize that if you wait several days after surgery to take a laxative, the constipation might be too severe for these over-the-counter options. It is always important to discuss all medications you plan on taking with your doctor. Ask your doctor if you can start the laxative immediately after surgery. *  Here are go-to post-surgery laxatives: Senna: Senna is an herb that acts as a stimulant laxative, meaning it increases the activity of the intestine to cause you to have a bowel movement. It comes in many forms, but senna pills are easy to take and are sold over the counter at almost all pharmacies. Since opioid pain medications slow down the activity of the intestine, it makes sense to take a medication to help reverse that side effect. Long-term use of a stimulant laxative is not a good idea since it can make your colon lazy and not function properly; however, temporary use immediately after surgery is acceptable. In general, if you are able to eat a normal diet, taking senna soon after surgery works the best. Senna usually works within hours to produce a bowel movement, but this is less predictable when you are taking different medications after surgery. Try not to wait several days to start taking senna, as often it is too late by then. Just like with all medications or supplements, check with your doctor before starting new treatment.   Magnesium: Magnesium is an important mineral that  our body needs. We get magnesium from some foods that we eat, especially foods that are high in fiber such as broccoli, almonds and whole grains. There are also magnesium-based medications used to treat constipation including milk of magnesia (magnesium hydroxide), magnesium citrate and  magnesium oxide. They work by drawing water into the intestine, putting it into the class of osmotic laxatives. Magnesium products in low doses appear to be safe, but if taken in very large doses, can lead to problems such as irregular heartbeat, low blood pressure and even death. It can also affect other medications you might be taking, therefore it is important to discuss using magnesium with your physician and pharmacist before initiating therapy. Most over-the-counter magnesium laxatives work very well to help with the constipation related to surgery, but sometimes they work too well and lead to diarrhea. Make sure you are somewhere with easy access to a bathroom, just in case.   Bisacodyl: Bisacodyl (generic name) is sold under brand names such as Dulcolax. Much like senna, it is a stimulant laxative, meaning it makes your intestines move more quickly to push out the stool. This is another good choice to start taking as soon as your doctor says you can take a laxative after surgery. It comes in pill form and as a suppository, which is a good choice for people who cannot or are not allowed to swallow pills. Studies have shown that it works as a laxative, but like most of these medications, you should use this on a short-term basis only.   Enema: Enemas strike fear in many people, but FEAR NOT! Its nowhere near as big a deal as you may think. An enema is just a way to get some liquid into your rectum by placing a specially designed device through your anus. If you have never done one, it might seem like a painful, unpleasant, uncomfortable, complicated and lengthy procedure. But in reality, its simple, takes just a few seconds and is highly effective. The small ready-made bottles you buy at the pharmacy are much easier than the hose/large rubber container type. Those recommended positions illustrated in some instructions are generally not necessary to place the enema. Its very similar to the insertion  of a tampon, requiring a slight squat. Some extra lubrication on the enemas tip (or on your anus) will make it a breeze. In certain cases, there is no substitute for a good enema. For example, if someone has not pooped for a few days, the beginning of the poop waiting to come out can become rock hard. Passing that hard stool can lead to much pain and problems like anal fissures. Inserting a little liquid to break up the rock-hard stool will help make its passage much easier. Enemas come with different liquids. Most come with saline, but there are also mineral oil options. You can also use warm water in the reusable enema containers. They all work. But since saline can sometimes be irritating, so try a mineral oil or water enema instead.  Here are commonly recommended constipation medications that do not work well for post-surgery constipation: Docusate: Docusate (generic name) most commonly referred to as Colace (brand name) is not really a laxative, but is classified as a stool softener. Although this medication is commonly prescribed, it is not recommended for several reasons: 1) there is no good medical evidence that it works 2) even if it has an effect, which is very questionable, it is minimal and cannot combat the intestinal slowing caused by the  opioid medications. Skip docusate to save money and space in your pillbox for something more effective.  PEG: Miralax (brand name) is basically a chemical called polyethylene glycol (PEG) and it has gained tremendous popularity as a laxative. This product is an osmotic laxative meaning it works by pulling water into the stool, making it softer. This is very similar to the action of natural fiber in foods and supplements. Therefore, the effect seen by this medication is not immediate, causing a bowel movement in a day or more. Is this medication strong enough to battle the constipation related to having an operation? Maybe for some people not prone to  constipation. But for most people, other laxatives are better to prevent constipation after surgery. Discharge instructions after   total hysterectomy   For the next three days, take ibuprofen and acetaminophen on a schedule, every 8 hours. You can take them together or you can intersperse them, and take one every four hours. I also gave you gabapentin for nighttime, to help you sleep and also to control pain. Take gabapentin medicines at night for at least the next 3 nights. You also have a narcotic, oxycodone, to take as needed if the above medicines don't help.  Postop constipation is a major cause of pain. Stay well hydrated, walk as you tolerate, and take over the counter senna as well as stool softeners if you need them.    Signs and Symptoms to Report Call our office at [phone] if you have any of the following.   Fever over 100.4 degrees or higher  Severe stomach pain not relieved with pain medications  Bright red bleeding thats heavier than a period that does not slow with rest  To go the bathroom a lot (frequency), you cant hold your urine (urgency), or it hurts when you empty your bladder (urinate)  Chest pain  Shortness of breath  Pain in the calves of your legs  Severe nausea and vomiting not relieved with anti-nausea medications  Signs of infection around your wounds, such as redness, hot to touch, swelling, green/yellow drainage (like pus), bad smelling discharge  Any concerns  What You Can Expect after Surgery  You may see some pink tinged, bloody fluid and bruising around the wound. This is normal.  You may notice shoulder and neck pain. This is caused by the gas used during surgery to expand your abdomen so your surgeon could get to the uterus easier.  You may have a sore throat because of the tube in your mouth during general anesthesia. This will go away in 2 to 3 days.  You may have some stomach cramps.  You may notice spotting on your  panties.  You may have pain around the incision sites.   Activities after Your Discharge Follow these guidelines to help speed your recovery at home:  Do the coughing and deep breathing as you did in the hospital for 2 weeks. Use the small blue breathing device, called the incentive spirometer for 2 weeks.  Dont drive if you are in pain or taking narcotic pain medicine. You may drive when you can safely slam on the brakes, turn the wheel forcefully, and rotate your torso comfortably. This is typically 1-2 weeks. Practice in a parking lot or side street prior to attempting to drive regularly.   Ask others to help with household chores for 4 weeks.  Do not lift anything heavier that 10 pounds for 4-6 weeks. This includes pets, children, and groceries.  Dont do  strenuous activities, exercises, or sports like vacuuming, tennis, squash, etc. until your doctor says it is safe to do so. ---Maintain pelvic rest for 8 weeks. This means nothing in the vagina or rectum at all (no douching, tampons, intercourse) for 8 weeks.   Walk as you feel able. Rest often since it may take two or three weeks for your energy level to return to normal.   You may climb stairs  Avoid constipation:   -Eat fruits, vegetables, and whole grains. Eat small meals as your appetite will take time to return to normal.   -Drink 6 to 8 glasses of water each day unless your doctor has told you to limit your fluids.   -Use a laxative or stool softener as needed if constipation becomes a problem. You may take Miralax, metamucil, Citrucil, Colace, Senekot, FiberCon, etc. If this does not relieve the constipation, try two tablespoons of Milk Of Magnesia every 8 hours until your bowels move.   You may shower. Gently wash the wounds with a mild soap and water. Pat dry.  Do not get in a hot tub, swimming pool, etc. for 6 weeks.  Do not use lotions, oils, powders on the wounds.  Do not douche, use tampons, or have sex until  your doctor says it is okay.  Take your pain medicine when you need it. The medicine may not work as well if the pain is bad.  Take the medicines you were taking before surgery. Other medications you will need are pain medications and possibly constipation and nausea medications (Zofran).

## 2018-10-29 NOTE — Progress Notes (Signed)
Patient has walked in hall several times tonight

## 2018-10-29 NOTE — Progress Notes (Addendum)
Patient discharged home. Discharge instructions and prescriptions given and reviewed with patient. Patient verbalized understanding. Will be escorted out by staff.

## 2018-10-31 LAB — PREPARE RBC (CROSSMATCH)

## 2019-03-26 ENCOUNTER — Other Ambulatory Visit: Payer: Self-pay | Admitting: Obstetrics and Gynecology

## 2019-03-26 DIAGNOSIS — Z1231 Encounter for screening mammogram for malignant neoplasm of breast: Secondary | ICD-10-CM

## 2019-04-01 ENCOUNTER — Inpatient Hospital Stay: Admission: RE | Admit: 2019-04-01 | Payer: BC Managed Care – PPO | Source: Ambulatory Visit

## 2019-09-07 ENCOUNTER — Encounter: Payer: Self-pay | Admitting: Emergency Medicine

## 2019-09-07 ENCOUNTER — Other Ambulatory Visit: Payer: Self-pay

## 2019-09-07 ENCOUNTER — Ambulatory Visit
Admission: EM | Admit: 2019-09-07 | Discharge: 2019-09-07 | Disposition: A | Discharge status: Home / Self Care | Payer: BC Managed Care – PPO | Attending: Family Medicine | Admitting: Family Medicine

## 2019-09-07 DIAGNOSIS — Z7901 Long term (current) use of anticoagulants: Secondary | ICD-10-CM | POA: Insufficient documentation

## 2019-09-07 DIAGNOSIS — Z79899 Other long term (current) drug therapy: Secondary | ICD-10-CM | POA: Insufficient documentation

## 2019-09-07 DIAGNOSIS — Z20822 Contact with and (suspected) exposure to covid-19: Secondary | ICD-10-CM | POA: Insufficient documentation

## 2019-09-07 DIAGNOSIS — I10 Essential (primary) hypertension: Secondary | ICD-10-CM | POA: Insufficient documentation

## 2019-09-07 DIAGNOSIS — F17210 Nicotine dependence, cigarettes, uncomplicated: Secondary | ICD-10-CM | POA: Insufficient documentation

## 2019-09-07 DIAGNOSIS — R059 Cough, unspecified: Secondary | ICD-10-CM

## 2019-09-07 DIAGNOSIS — R05 Cough: Secondary | ICD-10-CM | POA: Diagnosis present

## 2019-09-07 DIAGNOSIS — R062 Wheezing: Secondary | ICD-10-CM | POA: Insufficient documentation

## 2019-09-07 MED ORDER — AZITHROMYCIN 250 MG PO TABS: ORAL_TABLET | ORAL | 0 refills | Status: DC

## 2019-09-07 MED ORDER — BENZONATATE 200 MG PO CAPS: 200.0000 mg | ORAL_CAPSULE | Freq: Three times a day (TID) | ORAL | 0 refills | Status: DC | PRN

## 2019-09-07 MED ORDER — ALBUTEROL SULFATE HFA 108 (90 BASE) MCG/ACT IN AERS: 1.0000 | INHALATION_SPRAY | Freq: Four times a day (QID) | RESPIRATORY_TRACT | 0 refills | Status: DC | PRN

## 2019-09-07 NOTE — ED Triage Notes (Signed)
Patient c/o cough and chest congestion that stated yesterday.  Patient denies fevers.

## 2019-09-07 NOTE — ED Provider Notes (Signed)
[facility]    CSN: [id number] Arrival date & time: 09/07/19  1510      History   Chief Complaint Chief Complaint  Patient presents with   Cough    HPI Katherine Berger is a 49 y.o. female.   49 yo female with a c/o cough and chest congestion for the past 2-3 days. Patient states her husband has been sick last week as well. Patient is a smoker. States she's been wheezing and cough is productive. Denies fevers but has had chills.      Past Medical History:  Diagnosis Date   Anemia    GERD (gastroesophageal reflux disease)    Hypertension     Patient Active Problem List   Diagnosis Date Noted   Fibroids, intramural 10/25/2018    Past Surgical History:  Procedure Laterality Date   ABDOMINAL HYSTERECTOMY     LAPAROSCOPIC HYSTERECTOMY N/A 10/25/2018   Procedure: HYSTERECTOMY TOTAL LAPAROSCOPIC CONVERTED TO OPEN;  Surgeon: Benjaman Kindler, MD;  Location: [facility] ORS;  Service: Gynecology;  Laterality: N/A;   LIPOSUCTION AUOLOGOUS FAT TRANSFER TO BUTTOCKS     TUBAL LIGATION      OB History   No obstetric history on file.      Home Medications    Prior to Admission medications   Medication Sig Start Date End Date Taking? Authorizing Provider  cetirizine (ZYRTEC) 10 MG tablet Take 10 mg by mouth daily.   Yes [provider]  ferrous sulfate 325 (65 FE) MG tablet Take 325 mg by mouth daily with breakfast.   Yes [provider]  lisinopril-hydrochlorothiazide (PRINZIDE,ZESTORETIC) 20-12.5 MG tablet Take 1 tablet by mouth daily.   Yes [provider]  Potassium 99 MG TABS Take 99 mg by mouth daily.   Yes [provider]  albuterol (VENTOLIN HFA) 108 (90 Base) MCG/ACT inhaler Inhale 1-2 puffs into the lungs every 6 (six) hours as needed for wheezing or shortness of breath. 09/07/19   Norval Gable, MD  azithromycin (ZITHROMAX Z-PAK) 250 MG tablet 2 tabs po once day 1, then 1 tab po qd for the next 4 days 09/07/19    Norval Gable, MD  benzonatate (TESSALON) 200 MG capsule Take 1 capsule (200 mg total) by mouth 3 (three) times daily as needed. 09/07/19   Norval Gable, MD  docusate sodium (COLACE) 100 MG capsule Take 100 mg by mouth daily.    [provider]  fluticasone (FLONASE) 50 MCG/ACT nasal spray Place 2 sprays into both nostrils daily as needed for allergies. 06/19/18   [provider]  gabapentin (NEURONTIN) 800 MG tablet Take 1 tablet (800 mg total) by mouth at bedtime for 14 days. Take nightly for 3 days, then up to 14 days as needed 10/29/18 11/12/18  Benjaman Kindler, MD  ibuprofen (ADVIL) 200 MG tablet Take 200 mg by mouth every 6 (six) hours as needed (takes 2 to 3 as needed).    [provider]  ketorolac (ACULAR) 0.5 % ophthalmic solution Place 1 drop into both eyes every 6 (six) hours. Patient not taking: Reported on 10/16/2018 05/16/18   Lorin Picket, PA-C  latanoprost (XALATAN) 0.005 % ophthalmic solution SMARTSIG:1 Drop(s) In Eye(s) Every Evening 08/25/19   [provider]  oxycodone (OXY-IR) 5 MG capsule Take 1 capsule (5 mg total) by mouth every 6 (six) hours as needed for pain. 10/29/18   Benjaman Kindler, MD    Family History Family History  Problem Relation Age of Onset   Healthy  Mother    Hypertension Father    COPD Father    Breast cancer Neg Hx     Social History Social History   Tobacco Use   Smoking status: Current Every Day Smoker    Types: Cigars   Smokeless tobacco: Never Used  Substance Use Topics   Alcohol use: Yes    Comment: occas   Drug use: Never     Allergies   Patient has no known allergies.   Review of Systems Review of Systems   Physical Exam Triage Vital Signs ED Triage Vitals  Enc Vitals Group     BP 09/07/19 1532 (!) 152/106     Pulse Rate 09/07/19 1532 100     Resp 09/07/19 1532 14     Temp 09/07/19 1532 98.7 F (37.1 C)     Temp Source 09/07/19 1532 Oral     SpO2 09/07/19 1532 99 %      Weight 09/07/19 1528 175 lb (79.4 kg)     Height 09/07/19 1528 5\' 2"  (1.575 m)     Head Circumference --      Peak Flow --      Pain Score 09/07/19 1528 10     Pain Loc --      Pain Edu? --      Excl. in [place]? --    No data found.  Updated Vital Signs BP (!) 152/106 (BP Location: Right Arm)    Pulse 100    Temp 98.7 F (37.1 C) (Oral)    Resp 14    Ht 5\' 2"  (1.575 m)    Wt 79.4 kg    LMP 10/23/2018 Comment: bleeding since may   SpO2 99%    BMI 32.01 kg/m   Visual Acuity Right Eye Distance:   Left Eye Distance:   Bilateral Distance:    Right Eye Near:   Left Eye Near:    Bilateral Near:     Physical Exam Vitals and nursing note reviewed.  Constitutional:      General: She is not in acute distress.    Appearance: She is not toxic-appearing or diaphoretic.  HENT:     Right Ear: Tympanic membrane is erythematous.     Left Ear: Tympanic membrane is erythematous.     Mouth/Throat:     Pharynx: Posterior oropharyngeal erythema present. No oropharyngeal exudate.  Cardiovascular:     Rate and Rhythm: Normal rate.     Heart sounds: Normal heart sounds.  Pulmonary:     Effort: Pulmonary effort is normal.     Breath sounds: Wheezing (diffuse ) and rhonchi (diffuse) present.  Musculoskeletal:     Cervical back: Neck supple.  Neurological:     Mental Status: She is alert.      UC Treatments / Results  Labs (all labs ordered are listed, but only abnormal results are displayed) Labs Reviewed  SARS CORONAVIRUS 2 (TAT 6-24 HRS)    EKG   Radiology No results found.  Procedures Procedures (including critical care time)  Medications Ordered in UC Medications - No data to display  Initial Impression / Assessment and Plan / UC Course  I have reviewed the triage vital signs and the nursing notes.  Pertinent labs & imaging results that were available during my care of the patient were reviewed by me and considered in my medical decision making (see chart for details).        Final Clinical Impressions(s) / UC Diagnoses   Final diagnoses:  Cough  Wheezing    ED Prescriptions    Medication Sig Dispense Auth. Provider   azithromycin (ZITHROMAX Z-PAK) 250 MG tablet 2 tabs po once day 1, then 1 tab po qd for the next 4 days 6 each Norval Gable, MD   albuterol (VENTOLIN HFA) 108 (90 Base) MCG/ACT inhaler Inhale 1-2 puffs into the lungs every 6 (six) hours as needed for wheezing or shortness of breath. 8 g Norval Gable, MD   benzonatate (TESSALON) 200 MG capsule Take 1 capsule (200 mg total) by mouth 3 (three) times daily as needed. 30 capsule Norval Gable, MD      1. diagnosis reviewed with patient 2. rx as per orders above; reviewed possible side effects, interactions, risks and benefits  3. Recommend supportive treatment with otc meds prn 4. Follow-up prn if symptoms worsen or don't improve  PDMP not reviewed this encounter.   Norval Gable, MD 09/07/19 [phone]

## 2019-09-08 LAB — SARS CORONAVIRUS 2 (TAT 6-24 HRS): SARS Coronavirus 2: NEGATIVE

## 2019-10-31 ENCOUNTER — Ambulatory Visit
Admission: EM | Admit: 2019-10-31 | Discharge: 2019-10-31 | Disposition: A | Discharge status: Home / Self Care | Payer: BC Managed Care – PPO | Attending: Internal Medicine | Admitting: Internal Medicine

## 2019-10-31 ENCOUNTER — Other Ambulatory Visit: Payer: Self-pay

## 2019-10-31 DIAGNOSIS — G4709 Other insomnia: Secondary | ICD-10-CM | POA: Diagnosis present

## 2019-10-31 DIAGNOSIS — N760 Acute vaginitis: Secondary | ICD-10-CM

## 2019-10-31 DIAGNOSIS — B9689 Other specified bacterial agents as the cause of diseases classified elsewhere: Secondary | ICD-10-CM | POA: Diagnosis present

## 2019-10-31 LAB — CBC WITH DIFFERENTIAL/PLATELET
Abs Immature Granulocytes: 0.01 10*3/uL (ref 0.00–0.07)
Basophils Absolute: 0 10*3/uL (ref 0.0–0.1)
Basophils Relative: 1 %
Eosinophils Absolute: 0.1 10*3/uL (ref 0.0–0.5)
Eosinophils Relative: 2 %
HCT: 44.7 % (ref 36.0–46.0)
Hemoglobin: 15 g/dL (ref 12.0–15.0)
Immature Granulocytes: 0 %
Lymphocytes Relative: 32 %
Lymphs Abs: 1.5 10*3/uL (ref 0.7–4.0)
MCH: 30.4 pg (ref 26.0–34.0)
MCHC: 33.6 g/dL (ref 30.0–36.0)
MCV: 90.5 fL (ref 80.0–100.0)
Monocytes Absolute: 0.4 10*3/uL (ref 0.1–1.0)
Monocytes Relative: 8 %
Neutro Abs: 2.7 10*3/uL (ref 1.7–7.7)
Neutrophils Relative %: 57 %
Platelets: 241 10*3/uL (ref 150–400)
RBC: 4.94 MIL/uL (ref 3.87–5.11)
RDW: 13.6 % (ref 11.5–15.5)
WBC: 4.8 10*3/uL (ref 4.0–10.5)
nRBC: 0 % (ref 0.0–0.2)

## 2019-10-31 LAB — URINALYSIS, COMPLETE (UACMP) WITH MICROSCOPIC
Bilirubin Urine: NEGATIVE
Glucose, UA: NEGATIVE mg/dL
Ketones, ur: NEGATIVE mg/dL
Leukocytes,Ua: NEGATIVE
Nitrite: NEGATIVE
Protein, ur: NEGATIVE mg/dL
Specific Gravity, Urine: 1.025 (ref 1.005–1.030)
pH: 6 (ref 5.0–8.0)

## 2019-10-31 LAB — WET PREP, GENITAL
Sperm: NONE SEEN
Trich, Wet Prep: NONE SEEN
WBC, Wet Prep HPF POC: NONE SEEN
Yeast Wet Prep HPF POC: NONE SEEN

## 2019-10-31 LAB — TSH: TSH: 0.637 u[IU]/mL (ref 0.350–4.500)

## 2019-10-31 LAB — BASIC METABOLIC PANEL
Anion gap: 6 (ref 5–15)
BUN: 19 mg/dL (ref 6–20)
CO2: 25 mmol/L (ref 22–32)
Calcium: 8.9 mg/dL (ref 8.9–10.3)
Chloride: 106 mmol/L (ref 98–111)
Creatinine, Ser: 0.73 mg/dL (ref 0.44–1.00)
GFR calc Af Amer: >60 mL/min (ref >60)
GFR calc non Af Amer: >60 mL/min (ref >60)
Glucose, Bld: 87 mg/dL (ref 70–99)
Potassium: 3.6 mmol/L (ref 3.5–5.1)
Sodium: 137 mmol/L (ref 135–145)

## 2019-10-31 LAB — CHLAMYDIA/NGC RT PCR (ARMC ONLY)
Chlamydia Tr: NOT DETECTED
N gonorrhoeae: NOT DETECTED

## 2019-10-31 MED ORDER — METRONIDAZOLE 500 MG PO TABS
500.0000 mg | ORAL_TABLET | Freq: Two times a day (BID) | ORAL | 0 refills | Status: AC
Start: 2019-10-31 — End: ?

## 2019-10-31 MED ORDER — FLUCONAZOLE 150 MG PO TABS
150.0000 mg | ORAL_TABLET | Freq: Once | ORAL | 0 refills | Status: DC
Start: 2019-10-31 — End: 2019-10-31

## 2019-10-31 MED ORDER — FLUCONAZOLE 150 MG PO TABS
150.0000 mg | ORAL_TABLET | Freq: Once | ORAL | 0 refills | Status: AC
Start: 2019-10-31 — End: 2019-10-31

## 2019-10-31 NOTE — ED Provider Notes (Signed)
[facility]    CSN: [id number] Arrival date & time: 10/31/19  1034      History   Chief Complaint Chief Complaint  Patient presents with   Vaginitis   Back Pain    HPI Katherine Berger is a 49 y.o. female with no past medical history comes to urgent care with complaints of thick white vaginal discharge with odor of 2 days duration.  The symptoms started insidiously and is gotten worse.  No dysuria urgency or frequency.  No lower abdominal pain.  Patient has 1 sexual partner and endorses deep dyspareunia.  No nausea, vomiting or diarrhea.  No trauma to the abdomen..   Patient complains of difficulty sleeping.  She has been stressed out recently because of situational/relationship stress.  She has difficulty falling asleep at night or when she sleeps she wakes up after a couple of hours. HPI  Past Medical History:  Diagnosis Date   Anemia    GERD (gastroesophageal reflux disease)    Hypertension     Patient Active Problem List   Diagnosis Date Noted   Fibroids, intramural 10/25/2018    Past Surgical History:  Procedure Laterality Date   ABDOMINAL HYSTERECTOMY     LAPAROSCOPIC HYSTERECTOMY N/A 10/25/2018   Procedure: HYSTERECTOMY TOTAL LAPAROSCOPIC CONVERTED TO OPEN;  Surgeon: Benjaman Kindler, MD;  Location: [facility] OR[facility];  Service: Gynecology;  Laterality: N/A;   LIPOSUCTION AUOLOGOUS FAT TRANSFER TO BUTTOCKS     TUBAL LIGATION      OB History   No obstetric history on file.      Home Medications    Prior to Admission medications   Medication Sig Start Date End Date Taking? Authorizing Provider  docusate sodium (COLACE) 100 MG capsule Take 100 mg by mouth daily.   Yes [provider]  ferrous sulfate 325 (65 FE) MG tablet Take 325 mg by mouth daily with breakfast.   Yes [provider]  ibuprofen (ADVIL) 200 MG tablet Take 200 mg by mouth every 6 (six) hours as needed (takes 2 to 3 as needed).   Yes [provider]    lisinopril-hydrochlorothiazide (PRINZIDE,ZESTORETIC) 20-12.5 MG tablet Take 1 tablet by mouth daily.   Yes [provider]  Potassium 99 MG TABS Take 99 mg by mouth daily.   Yes [provider]  fluconazole (DIFLUCAN) 150 MG tablet Take 1 tablet (150 mg total) by mouth once for 1 dose. Please repeat it ion 72 hours if no improvement in symptoms 10/31/19 10/31/19  Fatmata Legere, Myrene Galas, MD  gabapentin (NEURONTIN) 800 MG tablet Take 1 tablet (800 mg total) by mouth at bedtime for 14 days. Take nightly for 3 days, then up to 14 days as needed 10/29/18 11/12/18  Benjaman Kindler, MD  latanoprost (XALATAN) 0.005 % ophthalmic solution SMARTSIG:1 Drop(s) In Eye(s) Every Evening 08/25/19   [provider]  metroNIDAZOLE (FLAGYL) 500 MG tablet Take 1 tablet (500 mg total) by mouth 2 (two) times daily. 10/31/19   Graceyn Fodor, Myrene Galas, MD  albuterol (VENTOLIN HFA) 108 (90 Base) MCG/ACT inhaler Inhale 1-2 puffs into the lungs every 6 (six) hours as needed for wheezing or shortness of breath. 09/07/19 10/31/19  Norval Gable, MD  cetirizine (ZYRTEC) 10 MG tablet Take 10 mg by mouth daily.  10/31/19  [provider]  fluticasone (FLONASE) 50 MCG/ACT nasal spray Place 2 sprays into both nostrils daily as needed for allergies. 06/19/18 10/31/19  [provider]    Family History Family History  Problem Relation  Age of Onset   Healthy Mother    Hypertension Father    COPD Father    Breast cancer Neg Hx     Social History Social History   Tobacco Use   Smoking status: Current Every Day Smoker    Types: Cigars   Smokeless tobacco: Never Used  Scientific laboratory technician Use: Never used  Substance Use Topics   Alcohol use: Yes    Comment: occas   Drug use: Never     Allergies   Patient has no known allergies.   Review of Systems Review of Systems  Gastrointestinal: Positive for abdominal pain. Negative for diarrhea, nausea and vomiting.  Genitourinary: Positive  for dyspareunia and vaginal discharge. Negative for dysuria, frequency, hematuria and urgency.  Musculoskeletal: Negative.   Neurological: Negative for dizziness, light-headedness and headaches.     Physical Exam Triage Vital Signs ED Triage Vitals  Enc Vitals Group     BP 10/31/19 1054 (!) 122/92     Pulse Rate 10/31/19 1054 81     Resp 10/31/19 1054 18     Temp 10/31/19 1054 98.4 F (36.9 C)     Temp Source 10/31/19 1054 Oral     SpO2 10/31/19 1054 100 %     Weight 10/31/19 1049 165 lb (74.8 kg)     Height 10/31/19 1049 5\' 2"  (1.575 m)     Head Circumference --      Peak Flow --      Pain Score 10/31/19 1049 7     Pain Loc --      Pain Edu? --      Excl. in [place]? --    No data found.  Updated Vital Signs BP (!) 122/92 (BP Location: Right Arm)    Pulse 81    Temp 98.4 F (36.9 C) (Oral)    Resp 18    Ht 5\' 2"  (1.575 m)    Wt 74.8 kg    LMP 10/23/2018    SpO2 100%    BMI 30.18 kg/m   Visual Acuity Right Eye Distance:   Left Eye Distance:   Bilateral Distance:    Right Eye Near:   Left Eye Near:    Bilateral Near:     Physical Exam Vitals and nursing note reviewed.  Constitutional:      Appearance: Normal appearance.  HENT:     Right Ear: Tympanic membrane normal.     Left Ear: Tympanic membrane normal.  Cardiovascular:     Pulses: Normal pulses.     Heart sounds: Normal heart sounds.  Pulmonary:     Effort: Pulmonary effort is normal.     Breath sounds: Normal breath sounds.  Abdominal:     General: Bowel sounds are normal. There is no distension.     Palpations: Abdomen is soft.     Tenderness: There is no abdominal tenderness. There is no guarding.  Musculoskeletal:        General: Normal range of motion.  Skin:    Capillary Refill: Capillary refill takes less than 2 seconds.  Neurological:     General: No focal deficit present.     Mental Status: She is alert.      UC Treatments / Results  Labs (all labs ordered are listed, but only abnormal  results are displayed) Labs Reviewed  WET PREP, GENITAL - Abnormal; Notable for the following components:      Result Value   Clue Cells Wet Prep HPF POC PRESENT (*)  All other components within normal limits  URINALYSIS, COMPLETE (UACMP) WITH MICROSCOPIC - Abnormal; Notable for the following components:   Hgb urine dipstick MODERATE (*)    Bacteria, UA FEW (*)    All other components within normal limits  CHLAMYDIA/NGC RT PCR ([facility] ONLY)  CBC WITH DIFFERENTIAL/PLATELET  BASIC METABOLIC PANEL  TSH    EKG   Radiology No results found.  Procedures Procedures (including critical care time)  Medications Ordered in UC Medications - No data to display  Initial Impression / Assessment and Plan / UC Course  I have reviewed the triage vital signs and the nursing notes.  Pertinent labs & imaging results that were available during my care of the patient were reviewed by me and considered in my medical decision making (see chart for details).    1.  Vulvovaginitis secondary to bacterial vaginosis: Wet prep is significant for BV Metronidazole 500 mg twice daily x7 days Fluconazole 150 mg orally x1 dose may be repeated in 72 hours Patient has a propensity for developing yeast infection when she is on antibiotics so the fluconazole is precautionary Return precautions given  2.  Insomnia likely secondary to situational stress: CBC, BMP, TSH Patient is advised to follow-up with her primary care physician for further evaluation regarding insomnia.  Final Clinical Impressions(s) / UC Diagnoses   Final diagnoses:  Bacterial vaginosis  Other insomnia   Discharge Instructions   None    ED Prescriptions    Medication Sig Dispense Auth. Provider   metroNIDAZOLE (FLAGYL) 500 MG tablet Take 1 tablet (500 mg total) by mouth 2 (two) times daily. 14 tablet Jentri Aye, Myrene Galas, MD   fluconazole (DIFLUCAN) 150 MG tablet  (Status: Discontinued) Take 1 tablet (150 mg total) by  mouth once for 1 dose. Please repeat it ion 72 hours if no improvement in symptoms 7 tablet Kimiyo Carmicheal, Myrene Galas, MD   fluconazole (DIFLUCAN) 150 MG tablet Take 1 tablet (150 mg total) by mouth once for 1 dose. Please repeat it ion 72 hours if no improvement in symptoms 2 tablet Desaray Marschner, Myrene Galas, MD     PDMP not reviewed this encounter.   Chase Picket, MD 10/31/19 [phone]

## 2019-10-31 NOTE — ED Triage Notes (Signed)
Patient complains of thick white vaginal discharge with odor x 2 days. Patient states that she has been having low back pain as well.

## 2020-05-24 ENCOUNTER — Inpatient Hospital Stay: Admission: RE | Admit: 2020-05-24 | Payer: BC Managed Care – PPO | Source: Ambulatory Visit

## 2021-06-03 ENCOUNTER — Other Ambulatory Visit: Payer: Self-pay | Admitting: Family Medicine

## 2021-06-03 DIAGNOSIS — Z1231 Encounter for screening mammogram for malignant neoplasm of breast: Secondary | ICD-10-CM

## 2021-06-16 ENCOUNTER — Inpatient Hospital Stay: Admission: RE | Admit: 2021-06-16 | Payer: Self-pay | Source: Ambulatory Visit

## 2021-10-10 ENCOUNTER — Ambulatory Visit
Admission: RE | Admit: 2021-10-10 | Discharge: 2021-10-10 | Disposition: A | Discharge status: Home / Self Care | Payer: 59 | Source: Ambulatory Visit | Attending: Family Medicine | Admitting: Family Medicine

## 2021-10-10 DIAGNOSIS — Z1231 Encounter for screening mammogram for malignant neoplasm of breast: Secondary | ICD-10-CM | POA: Diagnosis present

## 2021-10-13 ENCOUNTER — Other Ambulatory Visit: Payer: Self-pay | Admitting: Family Medicine

## 2021-10-13 DIAGNOSIS — N6489 Other specified disorders of breast: Secondary | ICD-10-CM

## 2021-10-13 DIAGNOSIS — R928 Other abnormal and inconclusive findings on diagnostic imaging of breast: Secondary | ICD-10-CM

## 2021-10-31 ENCOUNTER — Ambulatory Visit
Admission: RE | Admit: 2021-10-31 | Discharge: 2021-10-31 | Disposition: A | Discharge status: Home / Self Care | Payer: 59 | Source: Ambulatory Visit | Attending: Family Medicine | Admitting: Family Medicine

## 2021-10-31 DIAGNOSIS — N6489 Other specified disorders of breast: Secondary | ICD-10-CM

## 2021-10-31 DIAGNOSIS — R928 Other abnormal and inconclusive findings on diagnostic imaging of breast: Secondary | ICD-10-CM
